# Patient Record
Sex: Male | Born: 1960 | Race: Black or African American | Hispanic: No | Marital: Single | Smoking: Current every day smoker
Health system: Southern US, Community
[De-identification: ages and names within clinical notes are randomized; demographics above are authoritative.]

## PROBLEM LIST (undated history)

## (undated) DIAGNOSIS — I10 Essential (primary) hypertension: Secondary | ICD-10-CM

## (undated) DIAGNOSIS — J189 Pneumonia, unspecified organism: Secondary | ICD-10-CM

## (undated) DIAGNOSIS — K859 Acute pancreatitis without necrosis or infection, unspecified: Secondary | ICD-10-CM

## (undated) DIAGNOSIS — K219 Gastro-esophageal reflux disease without esophagitis: Secondary | ICD-10-CM

## (undated) DIAGNOSIS — M1990 Unspecified osteoarthritis, unspecified site: Secondary | ICD-10-CM

## (undated) DIAGNOSIS — F32A Depression, unspecified: Secondary | ICD-10-CM

## (undated) DIAGNOSIS — F1910 Other psychoactive substance abuse, uncomplicated: Secondary | ICD-10-CM

## (undated) DIAGNOSIS — F329 Major depressive disorder, single episode, unspecified: Secondary | ICD-10-CM

## (undated) DIAGNOSIS — F419 Anxiety disorder, unspecified: Secondary | ICD-10-CM

## (undated) HISTORY — DX: Pneumonia, unspecified organism: J18.9

## (undated) HISTORY — DX: Major depressive disorder, single episode, unspecified: F32.9

## (undated) HISTORY — DX: Gastro-esophageal reflux disease without esophagitis: K21.9

## (undated) HISTORY — DX: Essential (primary) hypertension: I10

## (undated) HISTORY — DX: Unspecified osteoarthritis, unspecified site: M19.90

## (undated) HISTORY — DX: Depression, unspecified: F32.A

## (undated) HISTORY — PX: OTHER SURGICAL HISTORY: SHX169

## (undated) HISTORY — DX: Other psychoactive substance abuse, uncomplicated: F19.10

## (undated) HISTORY — DX: Acute pancreatitis without necrosis or infection, unspecified: K85.90

## (undated) HISTORY — DX: Anxiety disorder, unspecified: F41.9

---

## 2005-10-28 ENCOUNTER — Emergency Department: Payer: Self-pay | Admitting: Emergency Medicine

## 2010-11-23 ENCOUNTER — Inpatient Hospital Stay: Payer: Self-pay | Admitting: Surgery

## 2010-11-26 LAB — CEA: CEA: 3.8 ng/mL (ref 0.0–4.7)

## 2010-11-26 LAB — CANCER ANTIGEN 19-9: CA 19-9: 157 U/mL — ABNORMAL HIGH (ref 0–35)

## 2010-11-30 LAB — PATHOLOGY REPORT

## 2012-05-21 ENCOUNTER — Emergency Department: Payer: Self-pay | Admitting: Emergency Medicine

## 2012-05-21 LAB — CBC
HCT: 40.8 % (ref 40.0–52.0)
HGB: 14.2 g/dL (ref 13.0–18.0)
MCH: 32 pg (ref 26.0–34.0)
MCHC: 34.7 g/dL (ref 32.0–36.0)
Platelet: 331 10*3/uL (ref 150–440)
RBC: 4.43 10*6/uL (ref 4.40–5.90)

## 2012-05-21 LAB — BASIC METABOLIC PANEL
Anion Gap: 10 (ref 7–16)
BUN: 9 mg/dL (ref 7–18)
Calcium, Total: 9.5 mg/dL (ref 8.5–10.1)
Chloride: 108 mmol/L — ABNORMAL HIGH (ref 98–107)
Co2: 22 mmol/L (ref 21–32)
Creatinine: 1.07 mg/dL (ref 0.60–1.30)
EGFR (African American): >60
Potassium: 4 mmol/L (ref 3.5–5.1)
Sodium: 140 mmol/L (ref 136–145)

## 2012-05-21 LAB — TROPONIN I: Troponin-I: <0.02 ng/mL

## 2012-05-21 LAB — CK TOTAL AND CKMB (NOT AT ARMC): CK, Total: 213 U/L (ref 35–232)

## 2013-05-18 LAB — CBC WITH DIFFERENTIAL/PLATELET
Basophil #: 0 10*3/uL (ref 0.0–0.1)
Eosinophil #: 0 10*3/uL (ref 0.0–0.7)
Eosinophil %: 0 %
HCT: 41.7 % (ref 40.0–52.0)
Lymphocyte #: 0.2 10*3/uL — ABNORMAL LOW (ref 1.0–3.6)
Monocyte #: 0 x10 3/mm — ABNORMAL LOW (ref 0.2–1.0)
Monocyte %: 0.4 %
Platelet: 308 10*3/uL (ref 150–440)

## 2013-05-18 LAB — URINALYSIS, COMPLETE
Bacteria: NONE SEEN
Glucose,UR: 50 mg/dL (ref 0–75)
Leukocyte Esterase: NEGATIVE
Nitrite: NEGATIVE
Ph: 5 (ref 4.5–8.0)
Protein: NEGATIVE
Specific Gravity: 1.016 (ref 1.003–1.030)
Squamous Epithelial: 1
WBC UR: 5 /HPF (ref 0–5)

## 2013-05-18 LAB — COMPREHENSIVE METABOLIC PANEL
Alkaline Phosphatase: 1492 U/L — ABNORMAL HIGH
Anion Gap: 7 (ref 7–16)
Bilirubin,Total: 3.9 mg/dL — ABNORMAL HIGH (ref 0.2–1.0)
Chloride: 101 mmol/L (ref 98–107)
Co2: 29 mmol/L (ref 21–32)
Creatinine: 0.88 mg/dL (ref 0.60–1.30)
EGFR (African American): >60
EGFR (Non-African Amer.): >60
Glucose: 97 mg/dL (ref 65–99)
Potassium: 3.3 mmol/L — ABNORMAL LOW (ref 3.5–5.1)
SGOT(AST): 490 U/L — ABNORMAL HIGH (ref 15–37)
SGPT (ALT): 271 U/L — ABNORMAL HIGH (ref 12–78)
Total Protein: 8.2 g/dL (ref 6.4–8.2)

## 2013-05-18 LAB — MAGNESIUM: Magnesium: 1.9 mg/dL

## 2013-05-18 LAB — LIPASE, BLOOD: Lipase: 200 U/L (ref 73–393)

## 2013-05-19 ENCOUNTER — Inpatient Hospital Stay: Payer: Self-pay | Admitting: Specialist

## 2013-05-20 LAB — CBC WITH DIFFERENTIAL/PLATELET
Basophil #: 0 10*3/uL (ref 0.0–0.1)
Eosinophil #: 0.2 10*3/uL (ref 0.0–0.7)
HCT: 33.6 % — ABNORMAL LOW (ref 40.0–52.0)
HGB: 10.8 g/dL — ABNORMAL LOW (ref 13.0–18.0)
Lymphocyte %: 8.7 %
MCH: 29.9 pg (ref 26.0–34.0)
Monocyte #: 0.6 x10 3/mm (ref 0.2–1.0)
Monocyte %: 4.3 %
Platelet: 224 10*3/uL (ref 150–440)
WBC: 13.4 10*3/uL — ABNORMAL HIGH (ref 3.8–10.6)

## 2013-05-20 LAB — COMPREHENSIVE METABOLIC PANEL
Anion Gap: 2 — ABNORMAL LOW (ref 7–16)
Bilirubin,Total: 1.7 mg/dL — ABNORMAL HIGH (ref 0.2–1.0)
Calcium, Total: 8.5 mg/dL (ref 8.5–10.1)
Chloride: 107 mmol/L (ref 98–107)
Co2: 32 mmol/L (ref 21–32)
Creatinine: 0.96 mg/dL (ref 0.60–1.30)
EGFR (Non-African Amer.): >60
Glucose: 125 mg/dL — ABNORMAL HIGH (ref 65–99)
Osmolality: 280 (ref 275–301)
Sodium: 141 mmol/L (ref 136–145)
Total Protein: 5.8 g/dL — ABNORMAL LOW (ref 6.4–8.2)

## 2013-05-21 LAB — CBC WITH DIFFERENTIAL/PLATELET
Eosinophil %: 1.8 %
HCT: 32 % — ABNORMAL LOW (ref 40.0–52.0)
HGB: 10.7 g/dL — ABNORMAL LOW (ref 13.0–18.0)
Lymphocyte #: 1.1 10*3/uL (ref 1.0–3.6)
MCH: 31.3 pg (ref 26.0–34.0)
MCV: 94 fL (ref 80–100)
Monocyte #: 0.5 x10 3/mm (ref 0.2–1.0)
Monocyte %: 5.2 %
Neutrophil #: 7.7 10*3/uL — ABNORMAL HIGH (ref 1.4–6.5)
Neutrophil %: 80.9 %
RBC: 3.41 10*6/uL — ABNORMAL LOW (ref 4.40–5.90)

## 2013-05-21 LAB — COMPREHENSIVE METABOLIC PANEL
Alkaline Phosphatase: 715 U/L — ABNORMAL HIGH
Anion Gap: 5 — ABNORMAL LOW (ref 7–16)
BUN: 6 mg/dL — ABNORMAL LOW (ref 7–18)
Bilirubin,Total: 1 mg/dL (ref 0.2–1.0)
Calcium, Total: 8.3 mg/dL — ABNORMAL LOW (ref 8.5–10.1)
Creatinine: 0.9 mg/dL (ref 0.60–1.30)
EGFR (African American): >60
Glucose: 125 mg/dL — ABNORMAL HIGH (ref 65–99)
Osmolality: 277 (ref 275–301)
Potassium: 3.5 mmol/L (ref 3.5–5.1)
SGOT(AST): 44 U/L — ABNORMAL HIGH (ref 15–37)
Sodium: 139 mmol/L (ref 136–145)
Total Protein: 5.8 g/dL — ABNORMAL LOW (ref 6.4–8.2)

## 2013-05-22 LAB — HEPATIC FUNCTION PANEL A (ARMC)
Albumin: 2.3 g/dL — ABNORMAL LOW (ref 3.4–5.0)
Alkaline Phosphatase: 619 U/L — ABNORMAL HIGH
Bilirubin, Direct: 0.4 mg/dL — ABNORMAL HIGH (ref 0.00–0.20)
SGOT(AST): 27 U/L (ref 15–37)
SGPT (ALT): 72 U/L (ref 12–78)
Total Protein: 5.8 g/dL — ABNORMAL LOW (ref 6.4–8.2)

## 2013-06-18 ENCOUNTER — Emergency Department: Payer: Self-pay | Admitting: Emergency Medicine

## 2013-06-18 LAB — URINALYSIS, COMPLETE
Bilirubin,UR: NEGATIVE
Nitrite: POSITIVE
PH: 5 (ref 4.5–8.0)
Protein: 30
RBC,UR: 16 /HPF (ref 0–5)
Specific Gravity: 1.019 (ref 1.003–1.030)
Squamous Epithelial: 1
WBC UR: 613 /HPF (ref 0–5)

## 2013-06-21 LAB — URINE CULTURE

## 2013-07-31 LAB — CULTURE, BLOOD (SINGLE)

## 2014-01-23 LAB — CBC AND DIFFERENTIAL
HCT: 38 % — AB (ref 41–53)
HEMOGLOBIN: 13 g/dL — AB (ref 13.5–17.5)
Neutrophils Absolute: 2 /uL
Platelets: 373 10*3/uL (ref 150–399)
WBC: 4.8 10^3/mL

## 2014-01-23 LAB — HEMOGLOBIN A1C: Hemoglobin A1C: 5.9

## 2014-01-23 LAB — HEPATIC FUNCTION PANEL
ALK PHOS: 162 U/L — AB (ref 25–125)
ALT: 100 U/L — AB (ref 10–40)
AST: 100 U/L — AB (ref 14–40)
BILIRUBIN, TOTAL: 0.4 mg/dL
Bilirubin, Direct: 0.18 mg/dL (ref 0.01–0.4)

## 2014-01-23 LAB — TSH: TSH: 0.68 u[IU]/mL (ref 0.41–5.90)

## 2014-02-05 LAB — BASIC METABOLIC PANEL
BUN: 15 mg/dL (ref 4–21)
CREATININE: 1.1 mg/dL (ref 0.6–1.3)
Glucose: 136 mg/dL
POTASSIUM: 5 mmol/L (ref 3.4–5.3)
SODIUM: 137 mmol/L (ref 137–147)

## 2014-02-14 ENCOUNTER — Ambulatory Visit: Payer: Self-pay | Admitting: Urology

## 2014-02-19 DIAGNOSIS — R7989 Other specified abnormal findings of blood chemistry: Secondary | ICD-10-CM | POA: Insufficient documentation

## 2014-02-19 DIAGNOSIS — R945 Abnormal results of liver function studies: Secondary | ICD-10-CM | POA: Insufficient documentation

## 2014-09-19 NOTE — Discharge Summary (Signed)
PATIENT NAME:  Sean MurrayLONG, Sean A MR#:  [id number] DATE OF BIRTH:  Oct 10, 1960  DATE OF ADMISSION:  05/19/2013 DATE OF DISCHARGE:  05/22/2013  HISTORY OF PRESENT ILLNESS:  For a detailed note, please take a look at the history and physical done on admission by Dr. Angelica Ranavid Hower.   DIAGNOSES AT DISCHARGE:  As follows:  Cholangitis with history of biliary stricture, abnormal liver LFTs secondary to cholangitis. Status post endoscopic retrograde cholangiopancreatography with sphincterotomy. Gram-negative bacteremia.   DIET: The patient is being discharged on a low-fat diet.   ACTIVITY: As tolerated.   FOLLOWUP: Follow up with Dr. Mechele CollinElliott in the next 1 to 2 weeks.   DISCHARGE MEDICATIONS:  Augmentin 500 mg/125 one tab b.i.d. x 11 days.   CONSULTANTS DURING THE HOSPITAL COURSE: Dr. Lutricia FeilPaul Oh and Dr. Mechele CollinElliott from gastroenterology.   PERTINENT STUDIES DONE DURING THE HOSPITAL COURSE: Are as follows: A chest x-ray done on admission showing no acute cardiopulmonary disease. An ultrasound of the abdomen, limited, showing upper limits of normal common bile duct caliber without evidence of intrahepatic ductal dilatation, sludge within the common bile duct identified. A CT scan of the abdomen and pelvis done with contrast showing common bile duct wall thickening and enhancement, question cholangitis; 2 mm distal common bile duct stone which does not appear to be obstructing. ERCP done on 05/20/2013 showing a normal cholangiogram, no evidence of any mass or stone status post sphincterotomy.   HOSPITAL COURSE: This is a 54 year old male with medical problems as mentioned above, who presented to the hospital with abdominal pain and nausea, vomiting, and noted to have abnormal LFTs.  1.  Abdominal pain, nausea, vomiting and abnormal LFTs. This was likely secondary to cholangitis. The patient had a limited abdominal ultrasound and CT scan findings which were suggestive of choledocholithiasis and a common bile duct  dilatation. The patient does have a history of a biliary stricture, status post stent placement in 2012. The patient was empirically started on IV Zosyn, started on IV fluids, antiemetics and pain control. A GI consult was obtained. The patient underwent ERCP on December 22nd which did not show any evidence of a stone or any obstruction or mass. The patient did undergo a sphincterotomy. Post and sphincterotomy, patient's abdominal pain and LFTs have significantly improved. He is now tolerating a regular diet well and, therefore, being discharged home. The patient will have followup with GI as an outpatient.  2.  Bacteremia. This was likely secondary to the cholangitis. The patient's blood cultures grew out gram-negative rods which have not been identified yet. The patient's blood cultures also grew out gram-variable rod which has also not been identified yet. The patient has been afebrile for the past 48 hours and hemodynamically stable, tolerating p.o. well. I did discharge him on oral Augmentin. His blood cultures will further be followed. If his antibiotics need to be changed, we will give him a call and let him know.  3.  Alcohol abuse. The patient was maintained on CIWA protocol in the hospital. He had no evidence of DTs prior to discharge.  4.  CODE STATUS: The patient is a full code.   TIME SPENT: 40 minutes.    ____________________________ Rolly PancakeVivek J. Cherlynn KaiserSainani, MD vjs:cs D: 05/22/2013 15:33:41 ET T: 05/22/2013 15:46:39 ET JOB#: [id number]  cc: Rolly PancakeVivek J. Cherlynn KaiserSainani, MD, <Dictator> Scot Junobert T. Elliott, MD Houston SirenVIVEK J Beverlie Kurihara MD ELECTRONICALLY SIGNED 05/23/2013 13:48

## 2014-09-19 NOTE — Consult Note (Signed)
ERCP done. Prominent ampulla. Nl CBD. No stone or stent seen. Multiple balloon sweeps done. Biliary sphincterotomy done. Good bile drainage. Montiner LFT.  Electronic Signatures: Lutricia Feilh, Lorann Tani (MD)  (Signed on 22-Dec-14 17:13)  Authored  Last Updated: 22-Dec-14 17:13 by Lutricia Feilh, Heinz Eckert (MD)

## 2014-09-19 NOTE — Consult Note (Signed)
Pt with GNR in blood culture.  Discussed case with Dr. Bluford Kaufmannh. Since the patient is on antibiotic coverage and his vital signs are stable at this time He feels we can schedule him for ERCP for tomorrow afternoon with DR. Oh.  Will talk with patient who has had an ERCP before.  Electronic Signatures: Scot JunElliott, Robert T (MD)  (Signed on 21-Dec-14 12:32)  Authored  Last Updated: 21-Dec-14 12:32 by Scot JunElliott, Robert T (MD)

## 2014-09-19 NOTE — Consult Note (Signed)
Pt with CBD stone on CT, may have passed it, had successful ERCP.  LFT's much better, pt feels much better.  I told him to be sure and take his antibiotics when he goes home due to his infection.  He ate all of his dinner tonight.  Could go home on appropriate antibiotics tomorrow and follow up in office in a week.  Electronic Signatures: Scot JunElliott, Mattison Golay T (MD)  (Signed on 23-Dec-14 17:38)  Authored  Last Updated: 23-Dec-14 17:38 by Scot JunElliott, Dynesha Woolen T (MD)

## 2014-09-19 NOTE — H&P (Signed)
PATIENT NAME:  Sean [name], Sean [name] MR#:  [id number] DATE OF BIRTH:  26-Sep-1960  DATE OF ADMISSION:  05/19/2013  REFERRING PHYSICIAN:  Dr. Sharyn CreamerMark [name].   PRIMARY CARE PHYSICIAN:  None.   CHIEF COMPLAINT:  Diffuse pain.   HISTORY OF PRESENT ILLNESS:  [name] 54 year old African American gentleman with history of gallstones status post biliary stent presenting with diffuse pain.  On further questioning he describes two day duration of mainly abdominal pain, worse with movement, but it has been gradually worsening.  He describes this only as pain rating the current intensity 8 to 9 out of 10, radiating diffusely over his abdomen.  He found no relieving factors.  No association with food; however, he does have associated nausea, vomiting.  Described his emesis as food-containing with bile tinge.  Also describes chills; however, denies any fever.  Does mention having acholic stools as well as darkened urine over the last 1 to 2 days currently without complaints.   REVIEW OF SYSTEMS:  CONSTITUTIONAL:  Positive for chills.  Denies any fever or weakness.  EYES:  Denied blurred vision, double vision, eye pain.  EARS, NOSE, THROAT:  Denies tinnitus, ear pain, hearing loss.  RESPIRATORY:  Denies cough, wheeze, shortness of breath.  CARDIOVASCULAR:  Denies chest pain, palpitations, edema.  GASTROINTESTINAL:  Positive for nausea, vomiting, abdominal pain, however denies any diarrhea or constipation.  GENITOURINARY:  Denies dysuria or hematuria.  ENDOCRINE:  Denies nocturia or thyroid problems.  HEMATOLOGY AND LYMPHATIC:  Denies easy bruising or bleeding.  SKIN:  Denies rash or lesions.  MUSCULOSKELETAL:  Denies pain in neck, back, shoulder, knees, hips, arthritic symptoms.  NEUROLOGIC:  Denies paralysis, paresthesias.  PSYCHIATRIC:  Denies anxiety or depressive symptoms.  Otherwise, full review of systems performed by me is negative.   PAST MEDICAL HISTORY:  Significant for gallstones status post biliary stenting.   He was apparently told to have the stent removed and has never done so.  This was about two years ago.  Denies any further history.   SOCIAL HISTORY:  Every day tobacco use, approximately 1 pack [name] day as well as every day alcohol use 3 to 4 beers daily.  Denies any drug usage.   FAMILY HISTORY:  Denies any cardiovascular, pulmonary or gastrointestinal diseases including biliary disease.   ALLERGIES:  No known drug allergies.   HOME MEDICATIONS:  None.   PHYSICAL EXAMINATION: VITAL SIGNS:  Temperature 97.7, heart rate 110, respirations 24, blood pressure 117/77, saturating 97% on room air.  Weight 61.2 kg, BMI of 21.8.  GENERAL:  Well-nourished, well-developed, African American gentleman, currently in no acute distress.  HEAD:  Normocephalic, atraumatic.  EYES:  Pupils equal, round, reactive to light.  Extraocular muscles intact with scleral icterus.  MOUTH:  Moist mucosal membranes.  Dentition intact.  No abscess noted.  Positive sublingual jaundice.   EAR, NOSE, THROAT:  Throat clear without exudates.  No external lesions.  NECK:  Supple.  No thyromegaly.  No nodules.  No JVD.  PULMONARY:  Clear to auscultation bilaterally.  No wheezes, rubs or rhonchi.  No use of accessory muscles.  Good respiratory effort.  CHEST:  Nontender to palpation.  CARDIOVASCULAR:  S1, S2, regular rate and rhythm.  No murmurs, rubs or gallops.  No edema.  Pedal pulses 2+ bilaterally.  GASTROINTESTINAL:  Soft, minimally tender to palpation over the epigastric region without rebound or guarding, nondistended.  No masses.  Murphy sign negative.  Hypoactive bowel sounds.  No hepatosplenomegaly.  MUSCULOSKELETAL:  No  swelling, clubbing or edema.  Range of motion full in all extremities.  NEUROLOGIC:  Cranial nerves II through XII intact.  No gross focal neurological deficits.  Sensation intact.  Reflexes intact.  SKIN:  No ulcerations, lesions, rashes, cyanosis.  Skin warm and dry.  Turgor is intact.  PSYCHIATRIC:   Mood and affect within normal limits.  The patient awake, alert, oriented x 3.   LABORATORY DATA:  Sodium 137, potassium 3.3, chloride 101, bicarb 29, BUN 6, creatinine 0.88, lipase 200.  LFTs:  Total protein 8.2, albumin 3.6, total bilirubin 3.9, alkaline phosphatase 1192, AST 490, ALT 271.  WBC 10.4, hemoglobin 14.3, platelets 308.  Urinalysis negative for evidence of infection.  Abdominal ultrasound performed revealing no focal obstruction, common bile duct is at upper limits of normal.   ASSESSMENT AND PLAN:  [name] 54 year old [place] American gentleman with history of gallstones status post biliary stent presenting with abdominal pain, found to have obstructive jaundice.  1.  Obstructive jaundice with history of biliary stenting.  Ultrasound has essentially been unrevealing.  CT scan is pending at this time.  Case has been discussed with gastroenterologist Dr. Mechele Collin who recommended blood cultures as well as Zosyn antibiotic coverage.  Gastroenterology consult has also been placed.  They will see the patient in the morning.  2.  History of alcohol abuse.  We will initiate CIWA protocol.  3.  Hypokalemia:  Replace to goal of 4 to 5.  4.  VTE prophylaxis with SCDs.   5.  CODE STATUS:  THE PATIENT IS FULL CODE.  TIME SPENT:  45 minutes.     ____________________________ Sean Athens. [name], MD dkh:ea D: 05/19/2013 00:28:28 ET T: 05/19/2013 03:06:00 ET JOB#: [id number]  cc: Sean Athens. Arayla Kruschke, MD, <Dictator> Riker Collier Synetta Shadow MD ELECTRONICALLY SIGNED 05/19/2013 22:49

## 2014-09-20 NOTE — Consult Note (Signed)
PATIENT NAME:  Sean [name], Sean [name] MR#:  [id number] DATE OF BIRTH:  [birth year]  DATE OF CONSULTATION:  05/19/2013   CONSULTING PHYSICIAN:  Scot Junobert T. Zevin Nevares, MD  ADDENDUM Sean [name] had an ERCP done by Dr. Niel HummerIftikhar on 11/24/2010. The lower third of the main common bile duct contained [name] questionable segmental stenosis 2 cm in length. The main bile duct was not dilated and no definite stones were seen at that time. [name] biliary sphincterotomy was made. There was no post sphincterotomy bleeding. The biliary tree was swept with 8.5 balloon  [name] 7 French 7 cm stent with [name] single external flap and single internal flap was placed 6 cm into the bile duct after obtaining brushings. The stent was felt to be in good position at the end of the procedure. He underwent gallbladder removal on 06/28 by Dr. Excell Seltzerooper. The findings at the time were acute cholecystitis, greatly edematous infundibulum and gallbladder fossa. There was no sign of [name] bile leak after the surgery.  The patient was discharged home the following day.    ____________________________ Scot Junobert T. Haily Caley, MD rte:sg D: 05/19/2013 09:53:26 ET T: 05/19/2013 10:41:13 ET JOB#: [id number]  cc: Scot Junobert T. Vi Whitesel, MD, <Dictator> Scot JunOBERT T Eriverto Byrnes MD ELECTRONICALLY SIGNED 06/13/2013 17:35

## 2014-09-20 NOTE — Consult Note (Signed)
PATIENT NAME:  Sean [name], Sean [name] MR#:  [id number] DATE OF BIRTH:  02/02/1961  DATE OF CONSULTATION:  05/19/2013  CONSULTING PHYSICIAN:  Scot Jun, MD  The patient is a 54 year old black male who had a previous gallbladder removed, who had ERCP and stent placement by Dr. Niel Hummer 2 or 3 years ago. He was advised to come back and have it removed and he never went back for his follow-up appointment. He presented to the ER with a couple of days of abdominal pain in his abdomen and in his back. He was found to have elevated liver functions, to have abnormal CT scan showing a common bile duct stone, as well as what looks to be a stent in the distal duodenum, first part of the jejunum. For all of these problems, he was appropriately admitted to the hospital and I was asked to see him in consultation.   ALLERGIES: No known drug allergies.   MEDICATIONS ON ADMISSION: No regular medications.   HABITS: Smokes half to 1 pack a day. Drinks mostly beer, mostly [employer].  PAST MEDICAL HISTORY: He had a colonoscopy at [facility] many years ago.   WORK HISTORY: He used to work as a Therapist, sports yards. Has not worked in the last couple of years.   REVIEW OF SYSTEMS: He denies any shortness of breath. No irregular skipping heartbeats. No chest pains. No dysphagia. He does have infrequent heartburn. His appetite is generally okay. There is no rectal bleeding. No dysuria, no hematuria. No kidney stones. He has nocturia x 2. No history of diabetes.   PHYSICAL EXAMINATION: GENERAL: Black male in no acute distress.  VITAL SIGNS: Temperature 98.2, pulse 96, respirations 18, blood pressure 110/76, pulse oximetry 98% on room air.  HEENT: Sclerae slightly icteric. Conjunctivae negative. Tongue negative.  HEAD: Atraumatic.  CHEST: Clear.  HEART: Shows no murmurs or gallops I can hear   ABDOMEN: Nondistended. Bowel sounds present. No hepatosplenomegaly.  EXTREMITIES: He has a small appendage on the right hand  number 5 finger, which runs in his family.   LABORATORY DATA: Glucose 97, BUN 6, creatinine 0.88, sodium 137, potassium 3.3, chloride 101, CO2 29, calcium 9.3, phosphorus 2.5, magnesium 1.9, total protein 8.2, albumin 3.6, total bilirubin 3.9, alkaline phosphatase 1492, SGOT 490, SGPT 271.   CAT scan of the abdomen shows wall thickening and enhancement of the common bile duct, question cholangitis, a 2 mm stone is identified in the distal common bile duct. Calcifications in the pancreas compatible with chronic calcific pancreatitis noted. There appears to be a common bile duct stent present within the distal duodenum and proximal jejunum. Ultrasound of the abdomen shows no evidence of intrahepatic biliary dilatation, trace amount of free fluid in the right upper abdomen. PA and lateral of the chest shows no evidence of acute cardiopulmonary disease.   ASSESSMENT/PLAN: He has a common duct stone and he has a very elevated alkaline phosphatase. 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 He appears to have had a stent in place and now appears to be wedged into the distal duodenum.   RECOMMENDATIONS:  1. Agree with antibiotic coverage for now.  2. We will need ERCP and probable sphincterotomy with stone removal, maybe brushings of the lining of the common bile duct, probably will need a upper endoscopy with a colonoscope to retrieve the stent.  I will consult Dr. Bluford Kaufmann tomorrow for the procedure.   ____________________________  Scot Junobert T. Mikah Poss, MD rte:sg D: 05/19/2013 09:49:16 ET T: 05/19/2013 10:23:41 ET JOB#: [id number]  cc: Scot Junobert T. Karron Alvizo, MD, <Dictator> Cletis Athensavid K. Hower, MD Rolly PancakeVivek J. Cherlynn KaiserSainani, MD  Scot JunOBERT T Eulises Kijowski MD ELECTRONICALLY SIGNED 06/13/2013 17:35

## 2014-09-24 ENCOUNTER — Emergency Department
Admit: 2014-09-24 | Disposition: A | Discharge status: Home / Self Care | Payer: Self-pay | Admitting: Emergency Medicine

## 2015-10-27 DIAGNOSIS — R7989 Other specified abnormal findings of blood chemistry: Secondary | ICD-10-CM

## 2015-10-27 DIAGNOSIS — R945 Abnormal results of liver function studies: Secondary | ICD-10-CM

## 2016-09-29 ENCOUNTER — Ambulatory Visit: Payer: Self-pay | Admitting: Urology

## 2016-09-29 VITALS — BP 115/81 | HR 91 | Temp 97.9°F | Ht 65.0 in | Wt 110.9 lb

## 2016-09-29 DIAGNOSIS — R945 Abnormal results of liver function studies: Secondary | ICD-10-CM

## 2016-09-29 DIAGNOSIS — R7989 Other specified abnormal findings of blood chemistry: Secondary | ICD-10-CM

## 2016-09-29 NOTE — Progress Notes (Signed)
  Patient: Sean [name] A [place] Male    DOB: 12/01/1960   56 y.o.   MRN: [id number] Visit Date: 09/29/2016  Today's Provider: [facility]   Chief Complaint  Patient presents with  . Labs Only   Subjective:    HPI   Patient states he needs labs drawn for an application for disability.   He denies any medical problems.  He says he whole body is falling apart.  Complains of leg cramps, stays tired, changes in vision.  He states he has a stent in his gallbladder and he was supposed to go back but did not follow up.    Having urine and fecal incontinence.    Hospitalized for pneumonia x 2.      No Known Allergies Previous Medications   No medications on file    Review of Systems  All other systems reviewed and are negative.   Social History  Substance Use Topics  . Smoking status: Current Every Day Smoker    Packs/day: 0.50    Years: 15.00    Types: Cigarettes  . Smokeless tobacco: Never Used  . Alcohol use 50.4 oz/week    84 Cans of beer per week     Comment: 40 oz four times/day   Objective:   BP 115/81   Pulse 91   Temp 97.9 F (36.6 C)   Ht 5\' 5"  (1.651 m)   Wt 110 lb 14.4 oz (50.3 kg)   BMI 18.45 kg/m   Physical Exam  Constitutional: He appears well-developed and well-nourished.  HENT:  Head: Normocephalic and atraumatic.  Right Ear: External ear normal.  Left Ear: External ear normal.  Cardiovascular: Normal rate, regular rhythm and normal heart sounds.   Pulmonary/Chest: Effort normal. He has wheezes. He has rales.  Musculoskeletal:  Very tender paraspinal muscles        Assessment & Plan:     1. History of abnormal LFT's  - drinks ETOH  - had stent put in the gallbladder  2. ETOH abuse  - 40 oz x 4  - occasional hard liquor  - arrange rehab - need to research  3. Tobacco abuse   4. Fecal and urine incontinence    Follow up to review labs      [facility]   [facility]

## 2016-10-03 LAB — COMPREHENSIVE METABOLIC PANEL
A/G RATIO: 1.6 (ref 1.2–2.2)
ALBUMIN: 4.7 g/dL (ref 3.5–5.5)
ALK PHOS: 88 IU/L (ref 39–117)
ALT: 27 IU/L (ref 0–44)
AST: 40 IU/L (ref 0–40)
BUN / CREAT RATIO: 12 (ref 9–20)
BUN: 10 mg/dL (ref 6–24)
CHLORIDE: 107 mmol/L — AB (ref 96–106)
CO2: 19 mmol/L (ref 18–29)
Calcium: 10.2 mg/dL (ref 8.7–10.2)
Creatinine, Ser: 0.81 mg/dL (ref 0.76–1.27)
GFR calc non Af Amer: 99 mL/min/{1.73_m2} (ref >59)
GFR, EST AFRICAN AMERICAN: 114 mL/min/{1.73_m2} (ref >59)
GLUCOSE: 97 mg/dL (ref 65–99)
Globulin, Total: 3 g/dL (ref 1.5–4.5)
POTASSIUM: 4.7 mmol/L (ref 3.5–5.2)
Sodium: 151 mmol/L — ABNORMAL HIGH (ref 134–144)
TOTAL PROTEIN: 7.7 g/dL (ref 6.0–8.5)

## 2016-10-03 LAB — LIPID PANEL
Chol/HDL Ratio: 3.3 ratio (ref 0.0–5.0)
Cholesterol, Total: 177 mg/dL (ref 100–199)
HDL: 54 mg/dL (ref >39)
LDL Calculated: 75 mg/dL (ref 0–99)
Triglycerides: 240 mg/dL — ABNORMAL HIGH (ref 0–149)
VLDL CHOLESTEROL CAL: 48 mg/dL — AB (ref 5–40)

## 2016-10-03 LAB — CBC WITH DIFFERENTIAL/PLATELET
BASOS ABS: 0 10*3/uL (ref 0.0–0.2)
Basos: 1 %
EOS (ABSOLUTE): 0 10*3/uL (ref 0.0–0.4)
Eos: 1 %
HEMATOCRIT: 34.5 % — AB (ref 37.5–51.0)
Hemoglobin: 11.6 g/dL — ABNORMAL LOW (ref 13.0–17.7)
Immature Grans (Abs): 0 10*3/uL (ref 0.0–0.1)
Immature Granulocytes: 0 %
LYMPHS ABS: 2.1 10*3/uL (ref 0.7–3.1)
Lymphs: 35 %
MCH: 32.2 pg (ref 26.6–33.0)
MCHC: 33.6 g/dL (ref 31.5–35.7)
MCV: 96 fL (ref 79–97)
MONOS ABS: 0.4 10*3/uL (ref 0.1–0.9)
Monocytes: 7 %
Neutrophils Absolute: 3.4 10*3/uL (ref 1.4–7.0)
Neutrophils: 56 %
Platelets: 295 10*3/uL (ref 150–379)
RBC: 3.6 x10E6/uL — AB (ref 4.14–5.80)
RDW: 14.1 % (ref 12.3–15.4)
WBC: 6 10*3/uL (ref 3.4–10.8)

## 2016-10-03 LAB — LIPASE: Lipase: 24 U/L (ref 13–78)

## 2016-10-03 LAB — HEMOGLOBIN A1C
ESTIMATED AVERAGE GLUCOSE: 151 mg/dL
HEMOGLOBIN A1C: 6.9 % — AB (ref 4.8–5.6)

## 2016-10-03 LAB — TSH: TSH: 0.543 u[IU]/mL (ref 0.450–4.500)

## 2016-10-03 LAB — AMYLASE: AMYLASE: 51 U/L (ref 31–124)

## 2016-10-03 LAB — HCV RNA QUANT: Hepatitis C Quantitation: NOT DETECTED IU/mL

## 2016-10-05 ENCOUNTER — Telehealth: Payer: Self-pay

## 2016-10-05 NOTE — Telephone Encounter (Signed)
Called pt with results. PT verbalized understanding and said he would go. Told pt to make f/u appt after her gets out and his sodium level is corrected.

## 2016-10-05 NOTE — Telephone Encounter (Signed)
-----   Message from Harle BattiestShannon A McGowan, PA-C sent at 10/04/2016  2:01 PM EDT ----- Patient's sodium level is elevated.  I suggest he seek treatment in the ED as the sodium level has to be corrected slowly.

## 2016-10-06 ENCOUNTER — Ambulatory Visit: Payer: Self-pay | Admitting: Ophthalmology

## 2016-10-13 ENCOUNTER — Ambulatory Visit: Payer: Self-pay | Admitting: Urology

## 2016-10-13 VITALS — BP 116/80 | HR 96 | Temp 98.2°F | Wt 112.0 lb

## 2016-10-13 DIAGNOSIS — K921 Melena: Secondary | ICD-10-CM

## 2016-10-13 DIAGNOSIS — Z720 Tobacco use: Secondary | ICD-10-CM

## 2016-10-13 DIAGNOSIS — F1010 Alcohol abuse, uncomplicated: Secondary | ICD-10-CM

## 2016-10-13 DIAGNOSIS — D649 Anemia, unspecified: Secondary | ICD-10-CM

## 2016-10-13 NOTE — Progress Notes (Signed)
  Patient: Sean [name] Male    DOB: 07/11/1960   56 y.o.   MRN: [id number] Visit Date: 10/13/2016  Today's Provider: Michiel CowboySHANNON Jerone Cudmore, PA-C   No chief complaint on file.  Subjective:    HPI Patient to review labs  Sodium 151, RBC's 3.60, Hbg 11.6, HCT 34.5 and HbgA1c 6.9%  Other labs are wnl  He states that since he has had his blood work drawn he has had difficulty swallowing.    Patient states he needs labs drawn for an application for disability.   He denies any medical problems.  He says he whole body is falling apart.  Complains of leg cramps, stays tired, changes in vision.  He states he has a stent in his gallbladder and he was supposed to go back but did not follow up.  He may have had an ERCP and the stent was dealt with at that time.    Having urine and fecal incontinence.  It is improving.  He states that he is having oily blood in his stools.      Hospitalized for pneumonia x 2.      No Known Allergies Previous Medications   No medications on file    Review of Systems  All other systems reviewed and are negative.   Social History  Substance Use Topics  . Smoking status: Current Every Day Smoker    Packs/day: 0.50    Years: 15.00    Types: Cigarettes  . Smokeless tobacco: Never Used  . Alcohol use 50.4 oz/week    84 Cans of beer per week     Comment: 40 oz four times/day   Objective:   BP 116/80   Pulse 96   Temp 98.2 F (36.8 C)   Wt 112 lb (50.8 kg)   BMI 18.64 kg/m   Physical Exam  Constitutional: He appears well-developed and well-nourished.  Smell of alcohol on breath  HENT:  Head: Normocephalic and atraumatic.  Right Ear: External ear normal.  Left Ear: External ear normal.  Cardiovascular: Normal rate, regular rhythm and normal heart sounds.   Pulmonary/Chest: Effort normal. He has wheezes. He has rales.  Abdominal: Soft. Bowel sounds are normal. He exhibits no distension and no mass. There is no tenderness. There is no rebound and no  guarding.  Musculoskeletal:  Very tender paraspinal muscles        Assessment & Plan:     1. History of abnormal LFT's  - drinks ETOH  - had stent put in the gallbladder  2. ETOH abuse  - 40 oz x 4  - occasional hard liquor  - patient is not wanting to stop drinking at this time  3. Tobacco abuse  - patient is not wanting to stop smoking at this time   4. Fecal and urine incontinence  - patient states it is improving  5. High sodium level  - patient instructed drink  80 ounces of water every two hours while awake  - recheck sodium in two weeks  6. History of pneumonia  - needs follow up chest x-ray - waiting on charity care  7. Low BMI despite high caloric intake  - ? malignancy  8. Bloody stools  - arrange for colonoscopy once charity care is approved  9. Anemia  - waiting on colonscopy        Michiel CowboySHANNON Nezar Buckles, PA-C   [facility]

## 2016-10-27 ENCOUNTER — Other Ambulatory Visit: Payer: Self-pay

## 2016-10-27 DIAGNOSIS — R945 Abnormal results of liver function studies: Secondary | ICD-10-CM

## 2016-10-27 DIAGNOSIS — R7989 Other specified abnormal findings of blood chemistry: Secondary | ICD-10-CM

## 2016-10-28 LAB — HEPATIC FUNCTION PANEL
ALBUMIN: 3.6 g/dL (ref 3.5–5.5)
ALT: 27 IU/L (ref 0–44)
AST: 43 IU/L — ABNORMAL HIGH (ref 0–40)
Alkaline Phosphatase: 160 IU/L — ABNORMAL HIGH (ref 39–117)
BILIRUBIN TOTAL: 0.4 mg/dL (ref 0.0–1.2)
Bilirubin, Direct: 0.14 mg/dL (ref 0.00–0.40)
TOTAL PROTEIN: 6.8 g/dL (ref 6.0–8.5)

## 2016-10-28 LAB — SODIUM: SODIUM: 143 mmol/L (ref 134–144)

## 2016-11-21 ENCOUNTER — Encounter: Payer: Self-pay | Admitting: Gastroenterology

## 2016-11-21 ENCOUNTER — Ambulatory Visit: Payer: Self-pay | Admitting: Gastroenterology

## 2017-01-25 ENCOUNTER — Inpatient Hospital Stay: Admit: 2017-01-25 | Payer: Self-pay

## 2017-01-25 ENCOUNTER — Other Ambulatory Visit
Admission: RE | Admit: 2017-01-25 | Discharge: 2017-01-25 | Disposition: A | Discharge status: Home / Self Care | Payer: Medicaid Other | Source: Ambulatory Visit | Attending: Gastroenterology | Admitting: Gastroenterology

## 2017-01-25 ENCOUNTER — Ambulatory Visit (INDEPENDENT_AMBULATORY_CARE_PROVIDER_SITE_OTHER): Payer: Medicaid Other | Admitting: Gastroenterology

## 2017-01-25 ENCOUNTER — Encounter (INDEPENDENT_AMBULATORY_CARE_PROVIDER_SITE_OTHER): Payer: Self-pay

## 2017-01-25 ENCOUNTER — Encounter: Payer: Self-pay | Admitting: Gastroenterology

## 2017-01-25 VITALS — BP 123/84 | HR 92 | Temp 98.2°F | Ht 65.0 in | Wt 121.6 lb

## 2017-01-25 DIAGNOSIS — F1991 Other psychoactive substance use, unspecified, in remission: Secondary | ICD-10-CM

## 2017-01-25 DIAGNOSIS — R7989 Other specified abnormal findings of blood chemistry: Secondary | ICD-10-CM

## 2017-01-25 DIAGNOSIS — K921 Melena: Secondary | ICD-10-CM | POA: Insufficient documentation

## 2017-01-25 DIAGNOSIS — K859 Acute pancreatitis without necrosis or infection, unspecified: Secondary | ICD-10-CM | POA: Insufficient documentation

## 2017-01-25 DIAGNOSIS — R945 Abnormal results of liver function studies: Secondary | ICD-10-CM

## 2017-01-25 DIAGNOSIS — Z87898 Personal history of other specified conditions: Secondary | ICD-10-CM | POA: Diagnosis not present

## 2017-01-25 LAB — URINE DRUG SCREEN, QUALITATIVE (ARMC ONLY)
AMPHETAMINES, UR SCREEN: NOT DETECTED
Barbiturates, Ur Screen: NOT DETECTED
Benzodiazepine, Ur Scrn: NOT DETECTED
CANNABINOID 50 NG, UR ~~LOC~~: NOT DETECTED
COCAINE METABOLITE, UR ~~LOC~~: POSITIVE — AB
MDMA (ECSTASY) UR SCREEN: NOT DETECTED
Methadone Scn, Ur: NOT DETECTED
Opiate, Ur Screen: NOT DETECTED
PHENCYCLIDINE (PCP) UR S: NOT DETECTED
TRICYCLIC, UR SCREEN: NOT DETECTED

## 2017-01-25 LAB — COMPREHENSIVE METABOLIC PANEL
ALT: 47 U/L (ref 17–63)
AST: 126 U/L — AB (ref 15–41)
Albumin: 3.6 g/dL (ref 3.5–5.0)
Alkaline Phosphatase: 137 U/L — ABNORMAL HIGH (ref 38–126)
Anion gap: 7 (ref 5–15)
BILIRUBIN TOTAL: 0.6 mg/dL (ref 0.3–1.2)
BUN: 12 mg/dL (ref 6–20)
CO2: 27 mmol/L (ref 22–32)
Calcium: 8.6 mg/dL — ABNORMAL LOW (ref 8.9–10.3)
Chloride: 106 mmol/L (ref 101–111)
Creatinine, Ser: 0.74 mg/dL (ref 0.61–1.24)
Glucose, Bld: 108 mg/dL — ABNORMAL HIGH (ref 65–99)
POTASSIUM: 3.9 mmol/L (ref 3.5–5.1)
Sodium: 140 mmol/L (ref 135–145)
TOTAL PROTEIN: 7.5 g/dL (ref 6.5–8.1)

## 2017-01-25 LAB — CBC WITH DIFFERENTIAL/PLATELET
BASOS ABS: 0.1 10*3/uL (ref 0–0.1)
Basophils Relative: 1 %
EOS ABS: 0.1 10*3/uL (ref 0–0.7)
EOS PCT: 1 %
HCT: 36.7 % — ABNORMAL LOW (ref 40.0–52.0)
Hemoglobin: 13 g/dL (ref 13.0–18.0)
LYMPHS PCT: 30 %
Lymphs Abs: 1.5 10*3/uL (ref 1.0–3.6)
MCH: 33.9 pg (ref 26.0–34.0)
MCHC: 35.5 g/dL (ref 32.0–36.0)
MCV: 95.3 fL (ref 80.0–100.0)
Monocytes Absolute: 0.4 10*3/uL (ref 0.2–1.0)
Monocytes Relative: 7 %
NEUTROS PCT: 61 %
Neutro Abs: 3.1 10*3/uL (ref 1.4–6.5)
PLATELETS: 287 10*3/uL (ref 150–440)
RBC: 3.85 MIL/uL — AB (ref 4.40–5.90)
RDW: 15.4 % — ABNORMAL HIGH (ref 11.5–14.5)
WBC: 5.1 10*3/uL (ref 3.8–10.6)

## 2017-01-25 NOTE — Progress Notes (Signed)
Wyline Mood MD, MRCP(U.K) [address]  [address]  [place], [place] [address]  Main: [phone]  Fax: [phone]   Gastroenterology Consultation  Referring Provider:      Primary Care Physician:  Patient, No Pcp Per Primary Gastroenterologist:  Dr. Wyline Mood  Reason for Consultation:   Blood in the stool and abnormal LFT's        HPI:   PARAM CAPRI is a 56 y.o. y/o male .He has been referred for blood in the stool . I do note LFT 09/2016 elevated Alkaline phosphotase and AST. Last office note in 09/2016 suggests some difficulty swallowing and blood in his stools. Was also noted to have normocytic anemia at that time.    Says he only saw blood in his stools on one occasion and has had none since. Never had a colonoscopy. No family history of colon cancer. He is a smoker- "i dont smoke that much ", drinks alcohol beer- 3-4 bottles a day for the past for many years. Used to use cocaine- last use was a year back . Today he says he has no issues swallowing at all. Gained 8 lbs. Denies HIV, Hepatitis B/C.   Says he has oil on his stool when he has a bowel movement . H/o pancreatitis in the past on multiple occasions.   Past Medical History:  Diagnosis Date  . Anxiety   . Arthritis   . Depression   . GERD (gastroesophageal reflux disease)   . Hypertension   . Pancreatitis   . Pneumonia   . Substance abuse     Past Surgical History:  Procedure Laterality Date  . gallstone removal      Prior to Admission medications   Medication Sig Start Date End Date Taking? Authorizing Provider  allopurinol (ZYLOPRIM) 300 MG tablet Take 1 tablet (300 mg total) by mouth Two (2) times a day. 11/03/16   [provider]  ALPHAGAN P 0.1 % SOLN Administer 1 drop into the left eye Two (2) times a day. 11/03/16   [provider]  aspirin 81 MG chewable tablet CHEW ONE TABLET BY MOUTH DAILY 11/03/16   [provider]  atorvastatin (LIPITOR) 40 MG tablet Take 1 tablet  (40 mg total) by mouth daily. For cholesterol 11/03/16   [provider]  BISACODYL 5 MG EC tablet TAKE ONE TABLET BY MOUTH EVERY DAY AS NEEDED FOR CONSTIPATION 11/17/16   [provider]  celecoxib (CELEBREX) 200 MG capsule Take 1 capsule (200 mg total) by mouth daily as needed for pain. 11/03/16   [provider]  colchicine 0.6 MG tablet TAKE ONE TABLET BY MOUTH EVERY DAY FOR GOUT 11/03/16   [provider]  dorzolamide-timolol (COSOPT) 22.3-6.8 MG/ML ophthalmic solution Administer 1 drop to both eyes Two (2) times a day. 11/03/16   [provider]  esomeprazole (NEXIUM) 40 MG capsule TAKE ONE CAPSULE BY MOUTH EVERY MORNING BEFORE BREAKFAST 11/03/16   [provider]  fluticasone (FLONASE) 50 MCG/ACT nasal spray Place 2 sprays into both nostrils daily. 11/03/16   [provider]  polyethylene glycol powder (GLYCOLAX/MIRALAX) powder MIX 17 G WITH 8 OZ OF FLUID AND TAKE BY MOUTH ONCE DAILY AS NEEDED 11/03/16   [provider]  prednisoLONE acetate (PRED FORTE) 1 % ophthalmic suspension INSTILL 1 DROP IN RIGHT EYE 4 TIMES A DAY 01/20/17   [provider]  tamsulosin (FLOMAX) 0.4 MG CAPS capsule Take 1 capsule (0.4 mg total) by mouth daily. 11/03/16  [provider]  TRAVATAN Z 0.004 % SOLN ophthalmic solution Administer 1 drop to both eyes every evening. 11/03/16   [provider]  VOLTAREN 1 % GEL APPLY 2 GRAMS TO JOINTS 3 TO 4 TIMES DAILY AS NEEDED FOR PAIN 10/06/16   [provider]    Family History  Problem Relation Age of Onset  . Alcohol abuse Mother   . Arthritis Mother   . Hypertension Mother   . Varicose Veins Mother   . Alcohol abuse Father   . Cancer Father   . Early death Father   . Alcohol abuse Sister   . Cancer Sister   . COPD Sister   . Depression Sister   . Early death Sister   . Heart disease Sister   . Varicose Veins Sister   . Birth defects Brother   . Diabetes Brother   .  Learning disabilities Brother   . Mental illness Brother   . Mental retardation Brother   . Vision loss Brother      Social History  Substance Use Topics  . Smoking status: Current Every Day Smoker    Packs/day: 0.50    Years: 15.00    Types: Cigarettes  . Smokeless tobacco: Never Used  . Alcohol use 50.4 oz/week    84 Cans of beer per week     Comment: 40 oz four times/day    Allergies as of 01/25/2017  . (No Known Allergies)    Review of Systems:    All systems reviewed and negative except where noted in HPI.   Physical Exam:  BP 123/84 (BP Location: Left Arm, Patient Position: Sitting, Cuff Size: Normal)   Pulse 92   Temp 98.2 F (36.8 C) (Oral)   Ht 5\' 5"  (1.651 m)   Wt 121 lb 9.6 oz (55.2 kg)   BMI 20.24 kg/m  No LMP for male patient. Psych:  Alert and cooperative. Normal mood and affect. General:   Alert,  Well-developed, well-nourished, pleasant and cooperative in NAD Head:  Normocephalic and atraumatic. Eyes:  Sclera clear, no icterus.   Conjunctiva pink. Ears:  Normal auditory acuity. Nose:  No deformity, discharge, or lesions. Mouth:  No deformity or lesions,oropharynx pink & moist. Neck:  Supple; no masses or thyromegaly. Lungs:  Respirations even and unlabored.  Clear throughout to auscultation.   No wheezes, crackles, or rhonchi. No acute distress. Heart:  Regular rate and rhythm; no murmurs, clicks, rubs, or gallops. Abdomen:  Normal bowel sounds.  No bruits.  Soft, non-tender and non-distended without masses, hepatosplenomegaly or hernias noted.  No guarding or rebound tenderness.    Msk:  Symmetrical without gross deformities. Good, equal movement & strength bilaterally. Neurologic:  Alert and oriented x3;  grossly normal neurologically. Skin:  Intact without significant lesions or rashes. No jaundice. Lymph Nodes:  No significant cervical adenopathy. Psych:  Alert and cooperative. Normal mood and affect.  Imaging Studies: No results  found.  Assessment and Plan:   Alison Murrayhomas A Kolenovic is a 56 y.o. y/o male has been referred for blood in hi stool, abnormal LFT's . H/o alcohol and drug abuse.    Plan  1. CBC 2. RUQ USG and repeat LFT',CMP- if LFT's still elevated will need viral and autoimmune hepatitis panel to be checked  3. Diagnostic colonoscopy  4. Stop alcohol use/no smoking   I have discussed alternative options, risks & benefits,  which include, but are not limited to, bleeding, infection, perforation,respiratory complication & drug reaction.  The  patient agrees with this plan & written consent will be obtained   Follow up in 2-3 months   Dr Wyline Mood MD,MRCP(U.K) .

## 2017-01-27 ENCOUNTER — Telehealth: Payer: Self-pay

## 2017-01-27 NOTE — Telephone Encounter (Signed)
Sean Classdvised Robin of pt's US scheduled for Wednesday, 9/5 @915am  @ [facility]. NPO 6 hrs prior

## 2017-01-27 NOTE — Telephone Encounter (Signed)
-----   Message from Wyline MoodKiran Anna, MD sent at 01/27/2017 10:48 AM EDT ----- Regarding: RE: Scheduling USG is ok no to colonoscopy , inform patient and advise to get back in touch when he has stopped all lcocaine    ----- Message ----- From: Ethlyn Galleryarter, Kerrigan Glendening, CMA Sent: 01/27/2017  10:37 AM To: Wyline MoodKiran Anna, MD Subject: Scheduling                                     Pt's drug test is positive for cocaine. Other labs abnormal as well.   So I'm assuming we can't schedule his colonoscopy or USG yet.  Is that right?  Thanks, CarMaxPanya

## 2017-01-27 NOTE — Telephone Encounter (Signed)
LVM for Zella BallRobin to callback.  Relay information per Dr. Tobi BastosAnna concerning procedures and US.

## 2017-02-01 ENCOUNTER — Other Ambulatory Visit: Payer: Self-pay | Admitting: Gastroenterology

## 2017-02-01 ENCOUNTER — Ambulatory Visit: Payer: Medicaid Other

## 2017-02-06 ENCOUNTER — Ambulatory Visit
Admission: RE | Admit: 2017-02-06 | Discharge: 2017-02-06 | Disposition: A | Discharge status: Home / Self Care | Payer: Medicaid Other | Source: Ambulatory Visit | Attending: Gastroenterology | Admitting: Gastroenterology

## 2017-02-06 DIAGNOSIS — K838 Other specified diseases of biliary tract: Secondary | ICD-10-CM | POA: Insufficient documentation

## 2017-02-06 DIAGNOSIS — R7989 Other specified abnormal findings of blood chemistry: Secondary | ICD-10-CM

## 2017-02-06 DIAGNOSIS — R945 Abnormal results of liver function studies: Secondary | ICD-10-CM | POA: Diagnosis present

## 2017-02-14 ENCOUNTER — Telehealth: Payer: Self-pay

## 2017-02-14 NOTE — Telephone Encounter (Signed)
Called patient to advise of results per Dr. Tobi Bastos:  1. USG shows a stent in the CBD- does he know how [place] he has had it for and why? 10 - 15 years during gallstone removal  2. May be the cause of his abdominal pain and abnormal LFT's as it may be blocked   3. Would benefit from removal of stent but will need to be done when he has no cocaine in system:  4. Ask him to call us when he has stopped all cocaine for a month for [place] to schedule the possible ERCP   5. Needs a pcp if he does not have one   Patient states he will call us when he's ready to have the stent removed and has stopped cocaine for 1 month.

## 2018-03-03 ENCOUNTER — Emergency Department: Payer: Medicaid Other

## 2018-03-03 ENCOUNTER — Emergency Department: Payer: Medicaid Other | Admitting: Anesthesiology

## 2018-03-03 ENCOUNTER — Inpatient Hospital Stay
Admission: EM | Admit: 2018-03-03 | Discharge: 2018-03-07 | DRG: 853 | Disposition: A | Discharge status: Home / Self Care | Payer: Medicaid Other | Attending: Surgery | Admitting: Surgery

## 2018-03-03 ENCOUNTER — Other Ambulatory Visit: Payer: Self-pay

## 2018-03-03 ENCOUNTER — Encounter: Payer: Self-pay | Admitting: Emergency Medicine

## 2018-03-03 ENCOUNTER — Encounter
Admission: EM | Disposition: A | Discharge status: Home / Self Care | Payer: Self-pay | Source: Home / Self Care | Attending: Surgery

## 2018-03-03 DIAGNOSIS — Z821 Family history of blindness and visual loss: Secondary | ICD-10-CM

## 2018-03-03 DIAGNOSIS — Z825 Family history of asthma and other chronic lower respiratory diseases: Secondary | ICD-10-CM | POA: Diagnosis not present

## 2018-03-03 DIAGNOSIS — R109 Unspecified abdominal pain: Secondary | ICD-10-CM | POA: Diagnosis present

## 2018-03-03 DIAGNOSIS — E876 Hypokalemia: Secondary | ICD-10-CM | POA: Diagnosis present

## 2018-03-03 DIAGNOSIS — Z8261 Family history of arthritis: Secondary | ICD-10-CM | POA: Diagnosis not present

## 2018-03-03 DIAGNOSIS — Z811 Family history of alcohol abuse and dependence: Secondary | ICD-10-CM

## 2018-03-03 DIAGNOSIS — A419 Sepsis, unspecified organism: Secondary | ICD-10-CM | POA: Diagnosis present

## 2018-03-03 DIAGNOSIS — Z833 Family history of diabetes mellitus: Secondary | ICD-10-CM | POA: Diagnosis not present

## 2018-03-03 DIAGNOSIS — Z810 Family history of intellectual disabilities: Secondary | ICD-10-CM | POA: Diagnosis not present

## 2018-03-03 DIAGNOSIS — K650 Generalized (acute) peritonitis: Secondary | ICD-10-CM | POA: Diagnosis present

## 2018-03-03 DIAGNOSIS — Z8249 Family history of ischemic heart disease and other diseases of the circulatory system: Secondary | ICD-10-CM | POA: Diagnosis not present

## 2018-03-03 DIAGNOSIS — K255 Chronic or unspecified gastric ulcer with perforation: Secondary | ICD-10-CM | POA: Diagnosis present

## 2018-03-03 DIAGNOSIS — M1990 Unspecified osteoarthritis, unspecified site: Secondary | ICD-10-CM | POA: Diagnosis present

## 2018-03-03 DIAGNOSIS — K275 Chronic or unspecified peptic ulcer, site unspecified, with perforation: Secondary | ICD-10-CM | POA: Diagnosis present

## 2018-03-03 DIAGNOSIS — F17210 Nicotine dependence, cigarettes, uncomplicated: Secondary | ICD-10-CM | POA: Diagnosis present

## 2018-03-03 DIAGNOSIS — I10 Essential (primary) hypertension: Secondary | ICD-10-CM | POA: Diagnosis present

## 2018-03-03 DIAGNOSIS — Z23 Encounter for immunization: Secondary | ICD-10-CM

## 2018-03-03 DIAGNOSIS — K631 Perforation of intestine (nontraumatic): Secondary | ICD-10-CM

## 2018-03-03 DIAGNOSIS — Z7951 Long term (current) use of inhaled steroids: Secondary | ICD-10-CM | POA: Diagnosis not present

## 2018-03-03 DIAGNOSIS — Z818 Family history of other mental and behavioral disorders: Secondary | ICD-10-CM | POA: Diagnosis not present

## 2018-03-03 DIAGNOSIS — R188 Other ascites: Secondary | ICD-10-CM | POA: Diagnosis present

## 2018-03-03 DIAGNOSIS — Z7982 Long term (current) use of aspirin: Secondary | ICD-10-CM | POA: Diagnosis not present

## 2018-03-03 DIAGNOSIS — K219 Gastro-esophageal reflux disease without esophagitis: Secondary | ICD-10-CM | POA: Diagnosis present

## 2018-03-03 DIAGNOSIS — I959 Hypotension, unspecified: Secondary | ICD-10-CM | POA: Diagnosis present

## 2018-03-03 DIAGNOSIS — Z809 Family history of malignant neoplasm, unspecified: Secondary | ICD-10-CM

## 2018-03-03 HISTORY — PX: LAPAROTOMY: SHX154

## 2018-03-03 LAB — COMPREHENSIVE METABOLIC PANEL
ALK PHOS: 124 U/L (ref 38–126)
ALT: 25 U/L (ref 0–44)
ANION GAP: 9 (ref 5–15)
AST: 34 U/L (ref 15–41)
Albumin: 2.9 g/dL — ABNORMAL LOW (ref 3.5–5.0)
BILIRUBIN TOTAL: 0.4 mg/dL (ref 0.3–1.2)
BUN: 6 mg/dL (ref 6–20)
CALCIUM: 8.1 mg/dL — AB (ref 8.9–10.3)
CO2: 23 mmol/L (ref 22–32)
Chloride: 106 mmol/L (ref 98–111)
Creatinine, Ser: 1.03 mg/dL (ref 0.61–1.24)
GFR calc Af Amer: >60 mL/min (ref >60)
GLUCOSE: 104 mg/dL — AB (ref 70–99)
POTASSIUM: 2.9 mmol/L — AB (ref 3.5–5.1)
Sodium: 138 mmol/L (ref 135–145)
TOTAL PROTEIN: 6.1 g/dL — AB (ref 6.5–8.1)

## 2018-03-03 LAB — CBC
HEMATOCRIT: 36.2 % — AB (ref 40.0–52.0)
HEMOGLOBIN: 11.7 g/dL — AB (ref 13.0–18.0)
MCH: 28.5 pg (ref 26.0–34.0)
MCHC: 32.4 g/dL (ref 32.0–36.0)
MCV: 87.7 fL (ref 80.0–100.0)
Platelets: 424 10*3/uL (ref 150–440)
RBC: 4.12 MIL/uL — ABNORMAL LOW (ref 4.40–5.90)
RDW: 20.1 % — AB (ref 11.5–14.5)
WBC: 12.6 10*3/uL — ABNORMAL HIGH (ref 3.8–10.6)

## 2018-03-03 LAB — URINE DRUG SCREEN, QUALITATIVE (ARMC ONLY)
Amphetamines, Ur Screen: NOT DETECTED
BARBITURATES, UR SCREEN: NOT DETECTED
Benzodiazepine, Ur Scrn: NOT DETECTED
CANNABINOID 50 NG, UR ~~LOC~~: NOT DETECTED
COCAINE METABOLITE, UR ~~LOC~~: POSITIVE — AB
MDMA (ECSTASY) UR SCREEN: NOT DETECTED
Methadone Scn, Ur: NOT DETECTED
Opiate, Ur Screen: NOT DETECTED
Phencyclidine (PCP) Ur S: NOT DETECTED
TRICYCLIC, UR SCREEN: NOT DETECTED

## 2018-03-03 LAB — LIPASE, BLOOD: LIPASE: 16 U/L (ref 11–51)

## 2018-03-03 LAB — TROPONIN I: Troponin I: <0.03 ng/mL (ref <0.03)

## 2018-03-03 SURGERY — LAPAROTOMY, EXPLORATORY
Anesthesia: General

## 2018-03-03 MED ORDER — ONDANSETRON HCL 4 MG/2ML IJ SOLN
4.0000 mg | Freq: Once | INTRAMUSCULAR | Status: AC
  Administered 2018-03-03: 4 mg via INTRAVENOUS
  Filled 2018-03-03: qty 2

## 2018-03-03 MED ORDER — THIAMINE HCL 100 MG/ML IJ SOLN
Freq: Once | INTRAVENOUS | Status: DC
  Filled 2018-03-03: qty 1000

## 2018-03-03 MED ORDER — SUCCINYLCHOLINE CHLORIDE 20 MG/ML IJ SOLN
INTRAMUSCULAR | Status: DC | PRN
  Administered 2018-03-03: 100 mg via INTRAVENOUS

## 2018-03-03 MED ORDER — SODIUM CHLORIDE 0.9 % IV BOLUS
1000.0000 mL | Freq: Once | INTRAVENOUS | Status: AC
  Administered 2018-03-03: 1000 mL via INTRAVENOUS

## 2018-03-03 MED ORDER — GLYCOPYRROLATE 0.2 MG/ML IJ SOLN
INTRAMUSCULAR | Status: AC
  Filled 2018-03-03: qty 1

## 2018-03-03 MED ORDER — PROPOFOL 10 MG/ML IV BOLUS
INTRAVENOUS | Status: DC | PRN
  Administered 2018-03-03: 160 mg via INTRAVENOUS

## 2018-03-03 MED ORDER — FENTANYL CITRATE (PF) 100 MCG/2ML IJ SOLN
INTRAMUSCULAR | Status: AC
  Filled 2018-03-03: qty 2

## 2018-03-03 MED ORDER — BUPIVACAINE-EPINEPHRINE (PF) 0.25% -1:200000 IJ SOLN
INTRAMUSCULAR | Status: AC
  Filled 2018-03-03: qty 30

## 2018-03-03 MED ORDER — FENTANYL CITRATE (PF) 100 MCG/2ML IJ SOLN
INTRAMUSCULAR | Status: DC | PRN
  Administered 2018-03-03: 100 ug via INTRAVENOUS

## 2018-03-03 MED ORDER — LIDOCAINE HCL (PF) 2 % IJ SOLN
INTRAMUSCULAR | Status: AC
  Filled 2018-03-03: qty 10

## 2018-03-03 MED ORDER — SODIUM CHLORIDE 0.9 % IJ SOLN
INTRAMUSCULAR | Status: DC | PRN
  Administered 2018-03-03: 50 mL

## 2018-03-03 MED ORDER — HYDROMORPHONE HCL 1 MG/ML IJ SOLN
INTRAMUSCULAR | Status: AC
  Filled 2018-03-03: qty 1

## 2018-03-03 MED ORDER — DEXAMETHASONE SODIUM PHOSPHATE 10 MG/ML IJ SOLN
INTRAMUSCULAR | Status: AC
  Filled 2018-03-03: qty 1

## 2018-03-03 MED ORDER — BUPIVACAINE LIPOSOME 1.3 % IJ SUSP
INTRAMUSCULAR | Status: DC | PRN
  Administered 2018-03-03: 20 mL

## 2018-03-03 MED ORDER — FENTANYL CITRATE (PF) 100 MCG/2ML IJ SOLN: 25.0000 ug | INTRAMUSCULAR | Status: DC | PRN

## 2018-03-03 MED ORDER — KETOROLAC TROMETHAMINE 30 MG/ML IJ SOLN
INTRAMUSCULAR | Status: AC
  Filled 2018-03-03: qty 1

## 2018-03-03 MED ORDER — PROMETHAZINE HCL 25 MG/ML IJ SOLN: 6.2500 mg | INTRAMUSCULAR | Status: DC | PRN

## 2018-03-03 MED ORDER — PIPERACILLIN-TAZOBACTAM 3.375 G IVPB 30 MIN
3.3750 g | Freq: Once | INTRAVENOUS | Status: AC
  Administered 2018-03-03: 3.375 g via INTRAVENOUS
  Filled 2018-03-03: qty 50

## 2018-03-03 MED ORDER — LABETALOL HCL 5 MG/ML IV SOLN
5.0000 mg | INTRAVENOUS | Status: AC | PRN
  Administered 2018-03-03 (×4): 5 mg via INTRAVENOUS

## 2018-03-03 MED ORDER — SODIUM CHLORIDE 0.9 % IV SOLN
INTRAVENOUS | Status: DC | PRN
  Administered 2018-03-03: 20:00:00 via INTRAVENOUS

## 2018-03-03 MED ORDER — HYDROMORPHONE HCL 1 MG/ML IJ SOLN
1.0000 mg | Freq: Once | INTRAMUSCULAR | Status: AC
  Administered 2018-03-03: 1 mg via INTRAVENOUS
  Filled 2018-03-03: qty 1

## 2018-03-03 MED ORDER — DEXAMETHASONE SODIUM PHOSPHATE 10 MG/ML IJ SOLN
INTRAMUSCULAR | Status: DC | PRN
  Administered 2018-03-03: 10 mg via INTRAVENOUS

## 2018-03-03 MED ORDER — ROCURONIUM BROMIDE 50 MG/5ML IV SOLN
INTRAVENOUS | Status: AC
  Filled 2018-03-03: qty 1

## 2018-03-03 MED ORDER — ONDANSETRON HCL 4 MG/2ML IJ SOLN
INTRAMUSCULAR | Status: AC
  Filled 2018-03-03: qty 2

## 2018-03-03 MED ORDER — KETOROLAC TROMETHAMINE 30 MG/ML IJ SOLN
30.0000 mg | Freq: Once | INTRAMUSCULAR | Status: AC | PRN
  Administered 2018-03-03: 30 mg via INTRAVENOUS

## 2018-03-03 MED ORDER — SUGAMMADEX SODIUM 200 MG/2ML IV SOLN
INTRAVENOUS | Status: AC
  Filled 2018-03-03: qty 2

## 2018-03-03 MED ORDER — SODIUM CHLORIDE FLUSH 0.9 % IV SOLN
INTRAVENOUS | Status: AC
  Filled 2018-03-03: qty 50

## 2018-03-03 MED ORDER — MIDAZOLAM HCL 2 MG/2ML IJ SOLN
INTRAMUSCULAR | Status: DC | PRN
  Administered 2018-03-03: 2 mg via INTRAVENOUS

## 2018-03-03 MED ORDER — PHENYLEPHRINE HCL 10 MG/ML IJ SOLN
INTRAMUSCULAR | Status: DC | PRN
  Administered 2018-03-03: 200 ug via INTRAVENOUS
  Administered 2018-03-03: 300 ug via INTRAVENOUS
  Administered 2018-03-03: 200 ug via INTRAVENOUS
  Administered 2018-03-03: 100 ug via INTRAVENOUS

## 2018-03-03 MED ORDER — BUPIVACAINE LIPOSOME 1.3 % IJ SUSP
INTRAMUSCULAR | Status: AC
  Filled 2018-03-03: qty 20

## 2018-03-03 MED ORDER — ROCURONIUM BROMIDE 100 MG/10ML IV SOLN
INTRAVENOUS | Status: DC | PRN
  Administered 2018-03-03: 20 mg via INTRAVENOUS

## 2018-03-03 MED ORDER — HYDROMORPHONE HCL 1 MG/ML IJ SOLN
INTRAMUSCULAR | Status: AC
  Administered 2018-03-03: 0.25 mg via INTRAVENOUS
  Filled 2018-03-03: qty 1

## 2018-03-03 MED ORDER — SUCCINYLCHOLINE CHLORIDE 20 MG/ML IJ SOLN
INTRAMUSCULAR | Status: AC
  Filled 2018-03-03: qty 1

## 2018-03-03 MED ORDER — MIDAZOLAM HCL 2 MG/2ML IJ SOLN
INTRAMUSCULAR | Status: AC
  Filled 2018-03-03: qty 2

## 2018-03-03 MED ORDER — IOPAMIDOL (ISOVUE-300) INJECTION 61%
100.0000 mL | Freq: Once | INTRAVENOUS | Status: AC | PRN
  Administered 2018-03-03: 100 mL via INTRAVENOUS

## 2018-03-03 MED ORDER — HYDROMORPHONE HCL 1 MG/ML IJ SOLN
0.2500 mg | INTRAMUSCULAR | Status: DC | PRN
  Administered 2018-03-03 (×2): 0.25 mg via INTRAVENOUS

## 2018-03-03 MED ORDER — SUGAMMADEX SODIUM 500 MG/5ML IV SOLN
INTRAVENOUS | Status: DC | PRN
  Administered 2018-03-03: 150 mg via INTRAVENOUS

## 2018-03-03 MED ORDER — POTASSIUM CHLORIDE 10 MEQ/100ML IV SOLN
10.0000 meq | INTRAVENOUS | Status: AC
  Administered 2018-03-03 (×4): 10 meq via INTRAVENOUS
  Filled 2018-03-03 (×4): qty 100

## 2018-03-03 MED ORDER — BUPIVACAINE-EPINEPHRINE (PF) 0.25% -1:200000 IJ SOLN
INTRAMUSCULAR | Status: DC | PRN
  Administered 2018-03-03: 30 mL

## 2018-03-03 MED ORDER — PROPOFOL 10 MG/ML IV BOLUS
INTRAVENOUS | Status: AC
  Filled 2018-03-03: qty 20

## 2018-03-03 MED ORDER — LIDOCAINE HCL (CARDIAC) PF 100 MG/5ML IV SOSY
PREFILLED_SYRINGE | INTRAVENOUS | Status: DC | PRN
  Administered 2018-03-03: 60 mg via INTRAVENOUS

## 2018-03-03 MED ORDER — ONDANSETRON HCL 4 MG/2ML IJ SOLN
INTRAMUSCULAR | Status: DC | PRN
  Administered 2018-03-03: 4 mg via INTRAVENOUS

## 2018-03-03 MED ORDER — HYDROMORPHONE HCL 1 MG/ML IJ SOLN
1.0000 mg | Freq: Once | INTRAMUSCULAR | Status: AC
  Administered 2018-03-03: 1 mg via INTRAVENOUS

## 2018-03-03 MED ORDER — LABETALOL HCL 5 MG/ML IV SOLN
INTRAVENOUS | Status: AC
  Administered 2018-03-03: 5 mg via INTRAVENOUS
  Filled 2018-03-03: qty 4

## 2018-03-03 SURGICAL SUPPLY — 48 items
APPLIER CLIP 11 MED OPEN (CLIP)
APPLIER CLIP 13 LRG OPEN (CLIP)
BARRIER ADH SEPRAFILM 3INX5IN (MISCELLANEOUS) IMPLANT
BLADE CLIPPER SURG (BLADE) ×2 IMPLANT
BLADE SURG SZ10 CARB STEEL (BLADE) ×2 IMPLANT
BULB RESERV EVAC DRAIN JP 100C (MISCELLANEOUS) ×2 IMPLANT
CANISTER SUCT 3000ML PPV (MISCELLANEOUS) ×4 IMPLANT
CHLORAPREP W/TINT 26ML (MISCELLANEOUS) ×2 IMPLANT
CLIP APPLIE 11 MED OPEN (CLIP) IMPLANT
CLIP APPLIE 13 LRG OPEN (CLIP) IMPLANT
DRAIN CHANNEL 19F RND (DRAIN) ×2 IMPLANT
DRAPE LAPAROTOMY 100X77 ABD (DRAPES) ×2 IMPLANT
DRAPE TABLE BACK 80X90 (DRAPES) IMPLANT
DRSG OPSITE POSTOP 4X8 (GAUZE/BANDAGES/DRESSINGS) ×2 IMPLANT
DRSG TEGADERM 2-3/8X2-3/4 SM (GAUZE/BANDAGES/DRESSINGS) IMPLANT
DRSG TELFA 3X8 NADH (GAUZE/BANDAGES/DRESSINGS) IMPLANT
ELECT BLADE 6.5 EXT (BLADE) ×2 IMPLANT
ELECT REM PT RETURN 9FT ADLT (ELECTROSURGICAL) ×2
ELECTRODE REM PT RTRN 9FT ADLT (ELECTROSURGICAL) ×1 IMPLANT
GAUZE 4X4 16PLY RFD (DISPOSABLE) IMPLANT
GLOVE BIO SURGEON STRL SZ7 (GLOVE) ×2 IMPLANT
GOWN STRL REUS W/ TWL LRG LVL3 (GOWN DISPOSABLE) ×2 IMPLANT
GOWN STRL REUS W/TWL LRG LVL3 (GOWN DISPOSABLE) ×2
HANDLE SUCTION POOLE (INSTRUMENTS) ×1 IMPLANT
HANDLE YANKAUER SUCT BULB TIP (MISCELLANEOUS) ×2 IMPLANT
LIGASURE IMPACT 36 18CM CVD LR (INSTRUMENTS) IMPLANT
NEEDLE HYPO 22GX1.5 SAFETY (NEEDLE) ×4 IMPLANT
NEEDLE HYPO 25X1 1.5 SAFETY (NEEDLE) ×2 IMPLANT
PACK BASIN MAJOR ARMC (MISCELLANEOUS) ×2 IMPLANT
RELOAD PROXIMATE 75MM BLUE (ENDOMECHANICALS) IMPLANT
SPONGE DRAIN TRACH 4X4 STRL 2S (GAUZE/BANDAGES/DRESSINGS) ×2 IMPLANT
SPONGE LAP 18X18 RF (DISPOSABLE) ×2 IMPLANT
SPONGE LAP 18X36 RFD (DISPOSABLE) ×2 IMPLANT
STAPLER PROXIMATE 75MM BLUE (STAPLE) IMPLANT
STAPLER SKIN PROX 35W (STAPLE) ×2 IMPLANT
SUCTION POOLE HANDLE (INSTRUMENTS) ×2
SUT PDS AB 0 CT1 27 (SUTURE) ×6 IMPLANT
SUT SILK 2 0 (SUTURE) ×1
SUT SILK 2 0 SH CR/8 (SUTURE) ×2 IMPLANT
SUT SILK 2 0SH CR/8 30 (SUTURE) ×2 IMPLANT
SUT SILK 2-0 18XBRD TIE 12 (SUTURE) ×1 IMPLANT
SUT VIC AB 0 CT1 36 (SUTURE) ×4 IMPLANT
SUT VIC AB 2-0 SH 27 (SUTURE) ×2
SUT VIC AB 2-0 SH 27XBRD (SUTURE) ×2 IMPLANT
SYR 30ML LL (SYRINGE) ×4 IMPLANT
SYR 3ML LL SCALE MARK (SYRINGE) ×2 IMPLANT
TAPE MICROFOAM 4IN (TAPE) ×2 IMPLANT
TRAY FOLEY MTR SLVR 16FR STAT (SET/KITS/TRAYS/PACK) ×2 IMPLANT

## 2018-03-03 NOTE — Anesthesia Post-op Follow-up Note (Signed)
Anesthesia QCDR form completed.        

## 2018-03-03 NOTE — Anesthesia Postprocedure Evaluation (Signed)
Anesthesia Post Note  Patient: Sean [name]  Procedure(s) Performed: EXPLORATORY LAPAROTOMY with  graham patch (N/A )  Patient location during evaluation: PACU Anesthesia Type: General Level of consciousness: awake and alert Pain management: pain level controlled Vital Signs Assessment: post-procedure vital signs reviewed and stable Respiratory status: spontaneous breathing, nonlabored ventilation and respiratory function stable Cardiovascular status: stable Postop Assessment: no apparent nausea or vomiting Anesthetic complications: no     Last Vitals:  Vitals:   03/03/18 2308 03/03/18 2318  BP: (!) 130/104 (!) 129/102  Pulse: 77 74  Resp: (!) 9 11  Temp:    SpO2: 97% 99%    Last Pain:  Vitals:   03/03/18 2318  TempSrc:   PainSc: Asleep                 Jovita Gamma

## 2018-03-03 NOTE — ED Notes (Signed)
Patient placed on 2L O2 via [place] for pulse ox of 88%.

## 2018-03-03 NOTE — Anesthesia Procedure Notes (Signed)
Procedure Name: Intubation Date/Time: 03/03/2018 8:31 PM Performed by: Ginger Carne, CRNA Pre-anesthesia Checklist: Patient identified, Emergency Drugs available, Suction available, Patient being monitored and Timeout performed Patient Re-evaluated:Patient Re-evaluated prior to induction Oxygen Delivery Method: Circle system utilized Preoxygenation: Pre-oxygenation with 100% oxygen Induction Type: IV induction, Rapid sequence and Cricoid Pressure applied Ventilation: Mask ventilation without difficulty Laryngoscope Size: Miller and 2 Grade View: Grade I Tube type: Oral Tube size: 7.5 mm Number of attempts: 1 Airway Equipment and Method: Stylet Placement Confirmation: ETT inserted through vocal cords under direct vision,  positive ETCO2 and breath sounds checked- equal and bilateral Secured at: 21 cm Tube secured with: Tape Dental Injury: Teeth and Oropharynx as per pre-operative assessment

## 2018-03-03 NOTE — Transfer of Care (Signed)
Immediate Anesthesia Transfer of Care Note  Patient: Sean [name]  Procedure(s) Performed: EXPLORATORY LAPAROTOMY with  graham patch (N/A )  Patient Location: PACU  Anesthesia Type:General  Level of Consciousness: awake, alert  and oriented  Airway & Oxygen Therapy: Patient Spontanous Breathing and Patient connected to face mask oxygen  Post-op Assessment: Report given to RN and Post -op Vital signs reviewed and stable  Post vital signs: Reviewed and stable  Last Vitals:  Vitals Value Taken Time  BP 163/119 03/03/2018  9:50 PM  Temp 36.3 C 03/03/2018  9:50 PM  Pulse 97 03/03/2018  9:54 PM  Resp 16 03/03/2018  9:54 PM  SpO2 97 % 03/03/2018  9:54 PM  Vitals shown include unvalidated device data.  Last Pain:  Vitals:   03/03/18 1912  TempSrc:   PainSc: 8          Complications: No apparent anesthesia complications

## 2018-03-03 NOTE — ED Notes (Signed)
Patient taken to CT scan.

## 2018-03-03 NOTE — ED Notes (Signed)
Patient assisted in use of urinal.

## 2018-03-03 NOTE — H&P (Signed)
Patient ID: Sean [name], male   DOB: March 17, 1961, 57 y.o.   MRN: [id number]  HPI Sean [name] is a 57 y.o. male w acute onsent of severe epigastric pain, pain is constant, sharp and worsening w movement. Some nausea but no emesis. He took some Sports administrator. And he takes "goodys" a few times a week and he drinks about 2-3 40 oz beers. CT pers reviewed showing free air and ascites. Likely from stomach. Wbc 12 , creat1. Coke + . Hypotensive in the Er and responded to fluids. Smokes daily. No previous abd operations   HPI  Past Medical History:  Diagnosis Date  . Anxiety   . Arthritis   . Depression   . GERD (gastroesophageal reflux disease)   . Hypertension   . Pancreatitis   . Pneumonia   . Substance abuse [facility])     Past Surgical History:  Procedure Laterality Date  . gallstone removal      Family History  Problem Relation Age of Onset  . Alcohol abuse Mother   . Arthritis Mother   . Hypertension Mother   . Varicose Veins Mother   . Alcohol abuse Father   . Cancer Father   . Early death Father   . Alcohol abuse Sister   . Cancer Sister   . COPD Sister   . Depression Sister   . Early death Sister   . Heart disease Sister   . Varicose Veins Sister   . Birth defects Brother   . Diabetes Brother   . Learning disabilities Brother   . Mental illness Brother   . Mental retardation Brother   . Vision loss Brother     Social History Social History   Tobacco Use  . Smoking status: Current Every Day Smoker    Packs/day: 0.50    Years: 15.00    Pack years: 7.50    Types: Cigarettes  . Smokeless tobacco: Never Used  Substance Use Topics  . Alcohol use: Yes    Alcohol/week: 84.0 standard drinks    Types: 84 Cans of beer per week    Comment: 40 oz four times/day  . Drug use: Yes    Types: Cocaine, Marijuana    Comment: Infrequent.    No Known Allergies  Current Facility-Administered Medications  Medication Dose Route Frequency Provider Last Rate Last Dose  .  potassium chloride 10 mEq in 100 mL IVPB  10 mEq Intravenous Q1 Hr x 4 Norman, Anne-Caroline, MD      . sodium chloride 0.9 % 1,000 mL with thiamine 100 mg, folic acid 1 mg, multivitamins adult 10 mL infusion   Intravenous Once Rockne Menghini, MD       Current Outpatient Medications  Medication Sig Dispense Refill  . allopurinol (ZYLOPRIM) 300 MG tablet Take 1 tablet (300 mg total) by mouth Two (2) times a day.  3  . ALPHAGAN P 0.1 % SOLN Administer 1 drop into the left eye Two (2) times a day.  99  . aspirin 81 MG chewable tablet CHEW ONE TABLET BY MOUTH DAILY  3  . atorvastatin (LIPITOR) 40 MG tablet Take 1 tablet (40 mg total) by mouth daily. For cholesterol  3  . BISACODYL 5 MG EC tablet TAKE ONE TABLET BY MOUTH EVERY DAY AS NEEDED FOR CONSTIPATION  11  . celecoxib (CELEBREX) 200 MG capsule Take 1 capsule (200 mg total) by mouth daily as needed for pain.  3  . colchicine 0.6 MG tablet TAKE ONE TABLET  BY MOUTH EVERY DAY FOR GOUT  3  . dorzolamide-timolol (COSOPT) 22.3-6.8 MG/ML ophthalmic solution Administer 1 drop to both eyes Two (2) times a day.  99  . esomeprazole (NEXIUM) 40 MG capsule TAKE ONE CAPSULE BY MOUTH EVERY MORNING BEFORE BREAKFAST  3  . fluticasone (FLONASE) 50 MCG/ACT nasal spray Place 2 sprays into both nostrils daily.  11  . polyethylene glycol powder (GLYCOLAX/MIRALAX) powder MIX 17 G WITH 8 OZ OF FLUID AND TAKE BY MOUTH ONCE DAILY AS NEEDED  5  . prednisoLONE acetate (PRED FORTE) 1 % ophthalmic suspension INSTILL 1 DROP IN RIGHT EYE 4 TIMES A DAY  1  . tamsulosin (FLOMAX) 0.4 MG CAPS capsule Take 1 capsule (0.4 mg total) by mouth daily.  3  . TRAVATAN Z 0.004 % SOLN ophthalmic solution Administer 1 drop to both eyes every evening.  99  . VOLTAREN 1 % GEL APPLY 2 GRAMS TO JOINTS 3 TO 4 TIMES DAILY AS NEEDED FOR PAIN  3     Review of Systems Full ROS  was asked and was negative except for the information on the HPI  Physical Exam Blood pressure (!) 159/97,  pulse 95, temperature (!) 97.5 F (36.4 C), temperature source Oral, resp. rate 20, height 5\' 11"  (1.803 m), weight 59 kg, SpO2 97 %. CONSTITUTIONAL: PT diaphoretic EYES: Pupils are equal, round, and reactive to light, Sclera are non-icteric. EARS, NOSE, MOUTH AND THROAT: The oropharynx is clear. The oral mucosa is pink and moist. Hearing is intact to voice. LYMPH NODES:  Lymph nodes in the neck are normal. RESPIRATORY:  Lungs are clear. There is normal respiratory effort, with equal breath sounds bilaterally, and without pathologic use of accessory muscles. CARDIOVASCULAR: Heart is regular without murmurs, gallops, or rubs. GI: The abdomen is exquisitely tender to palpation w + rebound and peritonitis. GU: Rectal deferred.   MUSCULOSKELETAL: Normal muscle strength and tone. No cyanosis or edema.   SKIN: Turgor is good and there are no pathologic skin lesions or ulcers. NEUROLOGIC: Motor and sensation is grossly normal. Cranial nerves are grossly intact. PSYCH:  Oriented to person, place and time. Affect is normal.  Data Reviewed  I have personally reviewed the patient's imaging, laboratory findings and medical records.    Assessment/Plan 57 yo w acute abdomen and peritonitis with Transient HD compromise. CT w free air. D/W the pt in detail about his disease process. D/w the pt about proceeding to the OR for laparotomy. Risks, benefits and possible complications including but not limited to bleeding, infection, anesthesia complications, re interventions bowel injuries, pain and even death. HE understands and wishes to proceed. He is peritonitic and septic and this is a true surgical . HE is a high risk for DT and perioperative complications given his malnutrition and ETOH abuse. We will resuscitate and start IV a/bs he is posted for emergency laparotomy  Sterling Big, MD FACS General Surgeon 03/03/2018, 8:03 PM

## 2018-03-03 NOTE — ED Notes (Signed)
Pharmacy called and will send fluids and potassium to same day surgery. OR informed.

## 2018-03-03 NOTE — Anesthesia Preprocedure Evaluation (Addendum)
Anesthesia Evaluation  Patient identified by MRN, date of birth, ID band Patient awake    Reviewed: Allergy & Precautions, H&P , NPO status , Patient's Chart, lab work & pertinent test results  Airway Mallampati: III   Neck ROM: limited   Comment: TM 2-3 FB Dental  (+) Chipped, Missing, Poor Dentition   Pulmonary Current Smoker,    breath sounds clear to auscultation       Cardiovascular hypertension,  Rhythm:regular Rate:Normal     Neuro/Psych PSYCHIATRIC DISORDERS Anxiety Depression negative neurological ROS     GI/Hepatic negative GI ROS, Neg liver ROS, GERD  ,(+)     substance abuse  alcohol use, cocaine use and marijuana use,   Endo/Other  negative endocrine ROS  Renal/GU      Musculoskeletal  (+) Arthritis ,   Abdominal   Peds  Hematology negative hematology ROS (+)   Anesthesia Other Findings Past Medical History: No date: Anxiety No date: Arthritis No date: Depression No date: GERD (gastroesophageal reflux disease) No date: Hypertension No date: Pancreatitis No date: Pneumonia No date: Substance abuse (HCC)  Past Surgical History: No date: gallstone removal  BMI    Body Mass Index:  18.13 kg/m      Reproductive/Obstetrics negative OB ROS                            Anesthesia Physical Anesthesia Plan  ASA: III and emergent  Anesthesia Plan: General ETT   Post-op Pain Management:    Induction:   PONV Risk Score and Plan: Ondansetron, Dexamethasone and Midazolam  Airway Management Planned:   Additional Equipment:   Intra-op Plan:   Post-operative Plan:   Informed Consent: I have reviewed the patients History and Physical, chart, labs and discussed the procedure including the risks, benefits and alternatives for the proposed anesthesia with the patient or authorized representative who has indicated his/her understanding and acceptance.   Dental Advisory  Given  Plan Discussed with: Anesthesiologist, CRNA and Surgeon  Anesthesia Plan Comments:         Anesthesia Quick Evaluation

## 2018-03-03 NOTE — Progress Notes (Signed)
CODE SEPSIS - PHARMACY COMMUNICATION  **Broad Spectrum Antibiotics should be administered within 1 hour of Sepsis diagnosis**  Time Code Sepsis Called/Page Received: 19:12  Antibiotics Ordered: Zosyn  Time of 1st antibiotic administration: 18:53  Additional action taken by pharmacy: n/a  If necessary, Name of Provider/Nurse Contacted: n/a    Foye Deer ,PharmD Clinical Pharmacist  03/03/2018  7:36 PM

## 2018-03-03 NOTE — ED Notes (Signed)
Report given to [facility] RN in the OR who states they will obtain consent.

## 2018-03-03 NOTE — ED Triage Notes (Signed)
Per EMS report, patient c/o severe abdominal pain in all 4 quadrants that began before patient had lunch. Patient reports a regular Bowel movement this morning. Patient is tender to touch. Per patient's mother whom he lives with, patient drinks alcohol all day [name]. Patient states he has an alcoholic drink a day.

## 2018-03-03 NOTE — Op Note (Addendum)
PROCEDURES: Laparotomy Repair of perforated ulcer with a Graham patch and 19 Fr blake placement  Pre-operative Diagnosis: perforated ulcer  Post-operative Diagnosis: same  Surgeon: Merri Ray Kenly Xiao    Anesthesia: General endotracheal anesthesia  ASA Class: 3   Surgeon: Sterling Big , MD FACS  Anesthesia: Gen. with endotracheal tube  Findings: Prepyloric ulcer with purulent peritonitis No evidence of leak after repair  Estimated Blood Loss: 25cc         Drains: 19 FR Blake          Complications: none              Condition: stable  Procedure Details  The patient was seen again in the Holding Room. The benefits, complications, treatment options, and expected outcomes were discussed with the patient. The risks of bleeding, infection, recurrence of symptoms, failure to resolve symptoms,  bowel injury, any of which could require further surgery were reviewed with the patient.   The patient was taken to Operating Room, identified as Sean [name] and the procedure verified.  A Time Out was held and the above information confirmed.  Prior to the induction of general anesthesia, antibiotic prophylaxis was administered. VTE prophylaxis was in place. General endotracheal anesthesia was then administered and tolerated well. After the induction, the abdomen was prepped with Chloraprep and draped in the sterile fashion. The patient was positioned in the supine position.  Upper midline laparotomy was performed using a 10 blade knife and electrocautery was used to dissect through subcutaneous tissue.  The fascia was incised and elevated.  The abdominal cavity was entered under direct visualization with visualization of  Obvious pnuemoperitoneum, there was also at least a liter  1/2 of purulent fluid. A retractor was placed and we identified the area of perforation in the prep allergic area.  We placed 3 interrupted silk sutures and created an omental flap from the greater omentum. We used ligasure  to dissect the pedicle and the pedicle was used as a   A Graham patch to close and reinforce the repair in the standard fashion.  There was no evidence of any leak.  We proceeded to run the bowel with no evidence of any injuries or any other perforations.  There was no other evidence of perforation in the posterior wall.  Liver did not look cirrhotic.  Spleen was normal. 19 Blake drain was placed on top of the repair.  We closed thefascia with a 0 PDS suture in a running fashion and the skin was closed withstaplesl. Liposomal Marcaine was injected on all incision sites under direct visualization. Needle and laparotomy count were correct and there were no immediate complications.  Sterling Big, MD, FACS

## 2018-03-03 NOTE — ED Provider Notes (Addendum)
[facility] Emergency Department Provider Note  ____________________________________________  Time seen: Approximately 5:12 PM  I have reviewed the triage vital signs and the nursing notes.   HISTORY  Chief Complaint Abdominal Pain    HPI Sean [name] is a 57 y.o. male a history of daily alcohol use, pancreatitis, GERD, presenting with severe abdominal pain.  The patient reports that around 12:30 PM today, he developed a severe pain diffusely in the abdomen.  He denies any nausea vomiting or diarrhea, fevers or chills, urinary symptoms.  He denies any chest pain or shortness of breath.  He tried an Alka-Seltzer for his pain, which did not help.  Upon arrival, the patient was noted to be hypotensive to the 70s over 50s, with a normal heart rate and a blood sugar in the 120s.  Past Medical History:  Diagnosis Date  . Anxiety   . Arthritis   . Depression   . GERD (gastroesophageal reflux disease)   . Hypertension   . Pancreatitis   . Pneumonia   . Substance abuse [facility])     Patient Active Problem List   Diagnosis Date Noted  . Pancreatitis   . Abnormal LFTs 02/19/2014    Past Surgical History:  Procedure Laterality Date  . gallstone removal      Current Outpatient Rx  . Order #: [id number] Class: Historical Med  . Order #: [id number] Class: Historical Med  . Order #: [id number] Class: Historical Med  . Order #: [id number] Class: Historical Med  . Order #: [id number] Class: Historical Med  . Order #: [id number] Class: Historical Med  . Order #: [id number] Class: Historical Med  . Order #: [id number] Class: Historical Med  . Order #: [id number] Class: Historical Med  . Order #: [id number] Class: Historical Med  . Order #: [id number] Class: Historical Med  . Order #: [id number] Class: Historical Med  . Order #: [id number] Class: Historical Med  . Order #: [id number] Class: Historical Med  . Order #: [id number] Class: Historical Med    Allergies Patient has no known  allergies.  Family History  Problem Relation Age of Onset  . Alcohol abuse Mother   . Arthritis Mother   . Hypertension Mother   . Varicose Veins Mother   . Alcohol abuse Father   . Cancer Father   . Early death Father   . Alcohol abuse Sister   . Cancer Sister   . COPD Sister   . Depression Sister   . Early death Sister   . Heart disease Sister   . Varicose Veins Sister   . Birth defects Brother   . Diabetes Brother   . Learning disabilities Brother   . Mental illness Brother   . Mental retardation Brother   . Vision loss Brother     Social History Social History   Tobacco Use  . Smoking status: Current Every Day Smoker    Packs/day: 0.50    Years: 15.00    Pack years: 7.50    Types: Cigarettes  . Smokeless tobacco: Never Used  Substance Use Topics  . Alcohol use: Yes    Alcohol/week: 84.0 standard drinks    Types: 84 Cans of beer per week    Comment: 40 oz four times/day  . Drug use: Yes    Types: Cocaine, Marijuana    Comment: Infrequent.    Review of Systems Constitutional: No fever/chills.  No lightheadedness or syncope. Eyes: No visual changes. ENT: No sore throat. No congestion or rhinorrhea. Cardiovascular: Denies chest pain. Denies palpitations. Respiratory: Denies shortness of breath.  No  cough. Gastrointestinal: Positive diffuse abdominal pain.  No nausea, no vomiting.  No diarrhea.  No constipation. Genitourinary: Negative for dysuria.  No urinary frequency. Musculoskeletal: Negative for back pain. Skin: Negative for rash. Neurological: Negative for headaches. No focal numbness, tingling or weakness.  Psych: Daily EtOH use.    ____________________________________________   PHYSICAL EXAM:  VITAL SIGNS: ED Triage Vitals  Enc Vitals Group     BP      Pulse      Resp      Temp      Temp src      SpO2      Weight      Height      Head Circumference      Peak Flow      Pain Score      Pain Loc      Pain Edu?      Excl. in GC?      Constitutional: Alert and oriented. Answers questions appropriately.  Chronically ill appearing and uncomfortable; nontoxic. Eyes: Conjunctivae are normal.  EOMI. positive scleral icterus. Head: Atraumatic. Nose: No congestion/rhinnorhea. Mouth/Throat: Mucous membranes are dry.  Neck: No stridor.  Supple.  Positive JVD.  No meningismus. Cardiovascular: Normal rate, regular rhythm. No murmurs, rubs or gallops.  Respiratory: Normal respiratory effort.  No accessory muscle use or retractions. Lungs CTAB.  No wheezes, rales or ronchi. Gastrointestinal: Soft, and nondistended.  Diffusely tender in the left upper quadrant and left lower quadrant with severe tenderness to palpation over the epigastrium.  Right upper quadrant is without tenderness to palpation; negative Murphy sign.  No guarding or rebound.  No peritoneal signs. Musculoskeletal: No LE edema.  Neurologic:  A&Ox3.  Speech is clear.  Face and smile are symmetric.  EOMI.  Moves all extremities well. Skin:  Skin is warm, dry and intact. No rash noted. Psychiatric: Mood and affect are normal. Speech and behavior are normal.  Normal judgement.  ____________________________________________   LABS (all labs ordered are listed, but only abnormal results are displayed)  Labs Reviewed  CBC - Abnormal; Notable for the following components:      Result Value   WBC 12.6 (*)    RBC 4.12 (*)    Hemoglobin 11.7 (*)    HCT 36.2 (*)    RDW 20.1 (*)    All other components within normal limits  COMPREHENSIVE METABOLIC PANEL - Abnormal; Notable for the following components:   Potassium 2.9 (*)    Glucose, Bld 104 (*)    Calcium 8.1 (*)    Total Protein 6.1 (*)    Albumin 2.9 (*)    All other components within normal limits  CULTURE, BLOOD (ROUTINE X 2)  CULTURE, BLOOD (ROUTINE X 2)  URINE CULTURE  LIPASE, BLOOD  TROPONIN I  LACTIC ACID, PLASMA  LACTIC ACID, PLASMA   ____________________________________________  EKG  ED ECG  REPORT I, Anne-Caroline Sharma Covert, the attending physician, personally viewed and interpreted this ECG.   Date: 03/03/2018  EKG Time: 1730  Rate: 75  Rhythm: normal sinus rhythm  Axis: normal  Intervals:prolonged Qtc  ST&T Change: No STEMI  ____________________________________________  RADIOLOGY  Ct Abdomen Pelvis W Contrast  Result Date: 03/03/2018 CLINICAL DATA:  58 year old male presents with severe abdominal pain starting today more focal in the epigastric region upon clinical exam per discussion with Dr. Sharma Covert. EXAM: CT ABDOMEN AND PELVIS WITH CONTRAST TECHNIQUE: Multidetector CT imaging of the abdomen and pelvis was performed using the standard protocol following bolus administration  of intravenous contrast. CONTRAST:  ISOVUE-300 IOPAMIDOL (ISOVUE-300) INJECTION 61% COMPARISON:  Same day CXR FINDINGS: Lower chest: Top-normal sized heart without pericardial effusion. Subsegmental atelectasis at each lung base. No effusion or pneumothorax. Hepatobiliary: Cholecystectomy. No enhancing hepatic mass. Periportal fluid is noted likely an extension the free fluid seen within the abdomen pelvis. Pancreas: Atrophic and densely calcified compatible with chronic pancreatitis. Spleen: No splenomegaly, mass or laceration. Adrenals/Urinary Tract: Normal bilateral adrenal glands. Symmetric cortical enhancement of both kidneys. No obstructive uropathy. The urinary bladder is unremarkable. Stomach/Bowel: Small hiatal hernia. Moderate fluid-filled distention of the stomach. Normal small bowel rotation without mechanical bowel obstruction. Moderate stool retention within the right colon. No definite annular constricting lesion. Probable mild sympathetic thickening of the descending colon through rectum. What appears to be the appendix is unremarkable. Vascular/Lymphatic: Minimal aortic atherosclerosis. Patent branch vessels without occlusion or dissection. No lymphadenopathy. Reproductive: Mild  prostatomegaly. Central zone calcifications are noted. Seminal vesicles are nonacute. Other: A moderate amount of free intra-abdominal and pelvic fluid is identified raising concern for peritonitis given the free intraperitoneal air identified predominantly within the upper abdomen. Suspect upper gastrointestinal source for the bowel perforation possibly the stomach given history of epigastric pain and scattered foci of free air more notably about the stomach. Musculoskeletal: Osteoarthritis of the right SI joint. Mild degenerative change along the dorsal spine. No acute nor aggressive osseous abnormality. IMPRESSION: 1. Free intraperitoneal air compatible with perforated hollow viscus. Given the preponderance of free air in the upper abdomen, suspected an upper gastrointestinal source especially in light of epigastric pain on reported clinical exam per discussion with Dr. Sharma Covert. Moderate amount of free intraperitoneal fluid raising concern for peritonitis. These results were called by telephone at the time of interpretation on 03/03/2018 at 6:45 pm to Dr. Rockne Menghini , who verbally acknowledged these results. 2. Densely calcified atrophic pancreas compatible with stigmata of chronic pancreatitis. 3. Cholecystectomy. Electronically Signed   By: Tollie Eth M.D.   On: 03/03/2018 18:52   Dg Chest Portable 1 View  Result Date: 03/03/2018 CLINICAL DATA:  Epigastric abdominal pain EXAM: PORTABLE CHEST 1 VIEW COMPARISON:  09/24/2014 chest radiograph. FINDINGS: Stable cardiomediastinal silhouette with normal heart size. No pneumothorax. No pleural effusion. Low lung volumes with mild hazy bibasilar lung opacities, favor mild atelectasis. No pulmonary edema. Surgical clips are seen in the right upper quadrant of the abdomen. IMPRESSION: Low lung volumes with mild hazy bibasilar lung opacities, favor atelectasis. Electronically Signed   By: Delbert Phenix M.D.   On: 03/03/2018 17:36     ____________________________________________   PROCEDURES  Procedure(s) performed: None  Procedures  Critical Care performed: Yes ____________________________________________   INITIAL IMPRESSION / ASSESSMENT AND PLAN / ED COURSE  Pertinent labs & imaging results that were available during my care of the patient were reviewed by me and considered in my medical decision making (see chart for details).  57 y.o. male w/ daily alcohol use, GERD and pancreatitis history presenting with severe abdominal pain.  Overall, the patient is mildly hypotensive and we will treat him with aggressive fluid resuscitation and close monitoring.  His abdominal examination is concerning for recurrent pancreatitis.  Other etiologies include peptic or gastric ulcer disease, including perforation so an upright chest has been ordered.  Given that he does have some tenderness to palpation in the left lower quadrant, diverticulitis is also on the differential.  A CT of the abdomen has been ordered as well.  The patient will receive symptomatic treatment, intravenous fluids,  and laboratory studies will be analyzed.  Plan reevaluation for final disposition.  ----------------------------------------- 6:44 PM on 03/03/2018 -----------------------------------------  I just been called by the radiologist, and there is perforated viscus in the patient's abdomen including some increased free fluid.  The exact area is unclear, but the stomach is suspected.    Clinically, the patient has improved and his blood pressure has increased with intravenous fluid resuscitation.  Zosyn has been ordered.  I have spoken with Dr. Everlene Farrier, the surgeon on-call, who will evaluate the patient and his imaging for admission.  ----------------------------------------- 7:15 PM on 03/03/2018 -----------------------------------------  The patient has improving blood pressure and continues to have normal mentation.  A code sepsis has been  called.  The patient is receiving antibiotics and additional intravenous fluids at this time.  Patient will go to the operating room shortly.  CRITICAL CARE Performed by: Rockne Menghini   Total critical care time: 45 minutes  Critical care time was exclusive of separately billable procedures and treating other patients.  Critical care was necessary to treat or prevent imminent or life-threatening deterioration.  Critical care was time spent personally by me on the following activities: development of treatment plan with patient and/or surrogate as well as nursing, discussions with consultants, evaluation of patient's response to treatment, examination of patient, obtaining history from patient or surrogate, ordering and performing treatments and interventions, ordering and review of laboratory studies, ordering and review of radiographic studies, pulse oximetry and re-evaluation of patient's condition.  ____________________________________________  FINAL CLINICAL IMPRESSION(S) / ED DIAGNOSES  Final diagnoses:  Hypotension, unspecified hypotension type  Hypokalemia  Perforation bowel (HCC)  Sepsis, due to unspecified organism, unspecified whether acute organ dysfunction present [facility])         NEW MEDICATIONS STARTED DURING THIS VISIT:  New Prescriptions   No medications on file      Rockne Menghini, MD 03/03/18 1851    Rockne Menghini, MD 03/03/18 1916

## 2018-03-04 ENCOUNTER — Encounter: Payer: Self-pay | Admitting: Surgery

## 2018-03-04 ENCOUNTER — Other Ambulatory Visit: Payer: Self-pay

## 2018-03-04 LAB — COMPREHENSIVE METABOLIC PANEL
ALK PHOS: 106 U/L (ref 38–126)
ALT: 25 U/L (ref 0–44)
ANION GAP: 9 (ref 5–15)
AST: 33 U/L (ref 15–41)
Albumin: 2.9 g/dL — ABNORMAL LOW (ref 3.5–5.0)
BILIRUBIN TOTAL: 1.2 mg/dL (ref 0.3–1.2)
BUN: 7 mg/dL (ref 6–20)
CO2: 23 mmol/L (ref 22–32)
CREATININE: 0.76 mg/dL (ref 0.61–1.24)
Calcium: 7.4 mg/dL — ABNORMAL LOW (ref 8.9–10.3)
Chloride: 105 mmol/L (ref 98–111)
GFR calc non Af Amer: >60 mL/min (ref >60)
Glucose, Bld: 169 mg/dL — ABNORMAL HIGH (ref 70–99)
Potassium: 3.9 mmol/L (ref 3.5–5.1)
SODIUM: 137 mmol/L (ref 135–145)
Total Protein: 6.3 g/dL — ABNORMAL LOW (ref 6.5–8.1)

## 2018-03-04 LAB — CBC
HEMATOCRIT: 34 % — AB (ref 40.0–52.0)
HEMOGLOBIN: 11.4 g/dL — AB (ref 13.0–18.0)
MCH: 29.2 pg (ref 26.0–34.0)
MCHC: 33.5 g/dL (ref 32.0–36.0)
MCV: 87 fL (ref 80.0–100.0)
Platelets: 276 10*3/uL (ref 150–440)
RBC: 3.9 MIL/uL — AB (ref 4.40–5.90)
RDW: 19.5 % — ABNORMAL HIGH (ref 11.5–14.5)
WBC: 10.6 10*3/uL (ref 3.8–10.6)

## 2018-03-04 LAB — MAGNESIUM: Magnesium: 1.6 mg/dL — ABNORMAL LOW (ref 1.7–2.4)

## 2018-03-04 LAB — LACTIC ACID, PLASMA
Lactic Acid, Venous: 1.5 mmol/L (ref 0.5–1.9)
Lactic Acid, Venous: 1.2 mmol/L (ref 0.5–1.9)

## 2018-03-04 LAB — PHOSPHORUS: PHOSPHORUS: 3.9 mg/dL (ref 2.5–4.6)

## 2018-03-04 MED ORDER — HYDROMORPHONE HCL 1 MG/ML IJ SOLN
1.0000 mg | INTRAMUSCULAR | Status: DC | PRN
  Administered 2018-03-04 – 2018-03-07 (×14): 1 mg via INTRAVENOUS
  Filled 2018-03-04 (×14): qty 1

## 2018-03-04 MED ORDER — HEPARIN SODIUM (PORCINE) 5000 UNIT/ML IJ SOLN
5000.0000 [IU] | Freq: Three times a day (TID) | INTRAMUSCULAR | Status: DC
  Administered 2018-03-04 – 2018-03-07 (×10): 5000 [IU] via SUBCUTANEOUS
  Filled 2018-03-04 (×10): qty 1

## 2018-03-04 MED ORDER — LORAZEPAM 2 MG/ML IJ SOLN: 1.0000 mg | Freq: Four times a day (QID) | INTRAMUSCULAR | Status: AC | PRN

## 2018-03-04 MED ORDER — PROCHLORPERAZINE MALEATE 10 MG PO TABS
10.0000 mg | ORAL_TABLET | Freq: Four times a day (QID) | ORAL | Status: DC | PRN
  Filled 2018-03-04: qty 1

## 2018-03-04 MED ORDER — ONDANSETRON 4 MG PO TBDP: 4.0000 mg | ORAL_TABLET | Freq: Four times a day (QID) | ORAL | Status: DC | PRN

## 2018-03-04 MED ORDER — DEXTROSE IN LACTATED RINGERS 5 % IV SOLN
INTRAVENOUS | Status: DC
  Administered 2018-03-04 – 2018-03-07 (×5): via INTRAVENOUS

## 2018-03-04 MED ORDER — PROCHLORPERAZINE EDISYLATE 10 MG/2ML IJ SOLN
5.0000 mg | Freq: Four times a day (QID) | INTRAMUSCULAR | Status: DC | PRN
  Filled 2018-03-04: qty 2

## 2018-03-04 MED ORDER — ONDANSETRON HCL 4 MG/2ML IJ SOLN
4.0000 mg | Freq: Four times a day (QID) | INTRAMUSCULAR | Status: DC | PRN
  Administered 2018-03-06: 4 mg via INTRAVENOUS
  Filled 2018-03-04: qty 2

## 2018-03-04 MED ORDER — THIAMINE HCL 100 MG/ML IJ SOLN
Freq: Once | INTRAVENOUS | Status: AC
  Administered 2018-03-04: 15:00:00 via INTRAVENOUS
  Filled 2018-03-04: qty 1000

## 2018-03-04 MED ORDER — INFLUENZA VAC SPLIT QUAD 0.5 ML IM SUSY
0.5000 mL | PREFILLED_SYRINGE | INTRAMUSCULAR | Status: AC
  Administered 2018-03-05: 0.5 mL via INTRAMUSCULAR
  Filled 2018-03-04: qty 0.5

## 2018-03-04 MED ORDER — PANTOPRAZOLE SODIUM 40 MG IV SOLR: 40.0000 mg | Freq: Two times a day (BID) | INTRAVENOUS | Status: DC

## 2018-03-04 MED ORDER — OXYCODONE HCL 5 MG PO TABS
5.0000 mg | ORAL_TABLET | ORAL | Status: DC | PRN
  Administered 2018-03-05: 5 mg via ORAL
  Administered 2018-03-05 – 2018-03-07 (×5): 10 mg via ORAL
  Filled 2018-03-04 (×5): qty 2
  Filled 2018-03-04: qty 1

## 2018-03-04 MED ORDER — LORAZEPAM 2 MG/ML IJ SOLN: 0.0000 mg | Freq: Four times a day (QID) | INTRAMUSCULAR | Status: AC

## 2018-03-04 MED ORDER — SODIUM CHLORIDE 0.9 % IV SOLN
8.0000 mg/h | INTRAVENOUS | Status: DC
  Administered 2018-03-04 – 2018-03-06 (×6): 8 mg/h via INTRAVENOUS
  Filled 2018-03-04 (×6): qty 80

## 2018-03-04 MED ORDER — PIPERACILLIN-TAZOBACTAM 3.375 G IVPB
3.3750 g | Freq: Three times a day (TID) | INTRAVENOUS | Status: DC
  Administered 2018-03-04 – 2018-03-07 (×10): 3.375 g via INTRAVENOUS
  Filled 2018-03-04 (×10): qty 50

## 2018-03-04 MED ORDER — THIAMINE HCL 100 MG/ML IJ SOLN
100.0000 mg | Freq: Every day | INTRAMUSCULAR | Status: DC
  Administered 2018-03-04 – 2018-03-05 (×2): 100 mg via INTRAVENOUS
  Filled 2018-03-04 (×2): qty 2

## 2018-03-04 MED ORDER — LORAZEPAM 1 MG PO TABS: 1.0000 mg | ORAL_TABLET | Freq: Four times a day (QID) | ORAL | Status: AC | PRN

## 2018-03-04 MED ORDER — HYDRALAZINE HCL 20 MG/ML IJ SOLN
10.0000 mg | INTRAMUSCULAR | Status: DC | PRN
  Administered 2018-03-05 – 2018-03-06 (×2): 10 mg via INTRAVENOUS
  Filled 2018-03-04 (×2): qty 1

## 2018-03-04 MED ORDER — LORAZEPAM 2 MG/ML IJ SOLN: 0.0000 mg | Freq: Two times a day (BID) | INTRAMUSCULAR | Status: DC

## 2018-03-04 MED ORDER — VITAMIN B-1 100 MG PO TABS
100.0000 mg | ORAL_TABLET | Freq: Every day | ORAL | Status: DC
  Administered 2018-03-06 – 2018-03-07 (×2): 100 mg via ORAL
  Filled 2018-03-04 (×2): qty 1

## 2018-03-04 MED ORDER — SODIUM CHLORIDE 0.9 % IV SOLN
80.0000 mg | Freq: Once | INTRAVENOUS | Status: AC
  Administered 2018-03-04: 80 mg via INTRAVENOUS
  Filled 2018-03-04: qty 80

## 2018-03-04 NOTE — Progress Notes (Signed)
POD # 1 perf ulcer Doing very well Avss, good UO  PE NAD Abd: dressing intact. JP serous, No infection  A/P Doing well Dc foley Ambulate Gi series in am before removing NGT, if no leak may dc ngt and start clears DT protocol

## 2018-03-04 NOTE — Progress Notes (Signed)
Foley removed from patient. Tolerated well, patient educated to call for assistance when needs to use the bathroom. Will continue to assess and monitor.

## 2018-03-05 ENCOUNTER — Inpatient Hospital Stay: Payer: Medicaid Other

## 2018-03-05 LAB — CBC
HCT: 32.6 % — ABNORMAL LOW (ref 40.0–52.0)
HEMOGLOBIN: 10.4 g/dL — AB (ref 13.0–18.0)
MCH: 28.1 pg (ref 26.0–34.0)
MCHC: 32 g/dL (ref 32.0–36.0)
MCV: 87.9 fL (ref 80.0–100.0)
Platelets: 303 10*3/uL (ref 150–440)
RBC: 3.71 MIL/uL — AB (ref 4.40–5.90)
RDW: 19.9 % — ABNORMAL HIGH (ref 11.5–14.5)
WBC: 13.2 10*3/uL — ABNORMAL HIGH (ref 3.8–10.6)

## 2018-03-05 LAB — MAGNESIUM: Magnesium: 1.8 mg/dL (ref 1.7–2.4)

## 2018-03-05 LAB — PHOSPHORUS: Phosphorus: 2.4 mg/dL — ABNORMAL LOW (ref 2.5–4.6)

## 2018-03-05 LAB — URINE CULTURE: Culture: <10000 — AB

## 2018-03-05 MED ORDER — IOPAMIDOL (ISOVUE-300) INJECTION 61%
50.0000 mL | Freq: Once | INTRAVENOUS | Status: AC | PRN
  Administered 2018-03-05 (×4): 50 mL via ORAL
  Filled 2018-03-05 (×4): qty 50

## 2018-03-05 NOTE — Progress Notes (Signed)
IS education complete, pt understands technique and reason for use. Pt inspire 750ml+. Pt independent with use 

## 2018-03-05 NOTE — Progress Notes (Signed)
[facility] Progress Note  2 Days Post-Op  Subjective: He feels improved this morning. Some pain near his incision. No nausea or emesis. Afebrile. Has been NPO. NGT with 350 out in last 24 hours. No complaints of hallucinations, palpitations, tremors.   Objective: Vital signs in last 24 hours: Temp:  [98.1 F (36.7 C)-98.4 F (36.9 C)] 98.4 F (36.9 C) (10/07 0458) Pulse Rate:  [86-91] 87 (10/07 0458) Resp:  [18-22] 22 (10/07 0458) BP: (139-164)/(88-107) 164/103 (10/07 0458) SpO2:  [98 %-100 %] 98 % (10/07 0458) Last BM Date: 03/03/18  Intake/Output from previous day: 10/06 0701 - 10/07 0700 In: 2394.1 [I.V.:2222.1; NG/GT:100; IV Piggyback:52] Out: 980 [Urine:600; Emesis/NG output:350; Drains:30] Intake/Output this shift: Total I/O In: 583.6 [I.V.:333.6; NG/GT:200; IV Piggyback:50] Out: 800 [Urine:800]  PE: Gen:  Alert, NAD, pleasant HEENT: NGT in place, poor dentition Card:  Regular rate and rhythm Pulm:  Normal effort, clear to auscultation bilaterally Abd: Soft, incisional tenderness, non-distended, midline laparotomy incision C/D/I, dressing in place, JP in epigastric/RUQ with serosanguinous fluid in bulb.  Skin: warm and dry, no rashes  Psych: A&Ox3   Lab Results:  Recent Labs    03/04/18 0046 03/05/18 0447  WBC 10.6 13.2*  HGB 11.4* 10.4*  HCT 34.0* 32.6*  PLT 276 303   BMET Recent Labs    03/03/18 1718 03/04/18 0046  NA 138 137  K 2.9* 3.9  CL 106 105  CO2 23 23  GLUCOSE 104* 169*  BUN 6 7  CREATININE 1.03 0.76  CALCIUM 8.1* 7.4*   PT/INR No results for input(s): LABPROT, INR in the last 72 hours. CMP     Component Value Date/Time   NA 137 03/04/2018 0046   NA 143 10/27/2016 1840   NA 139 05/21/2013 0540   K 3.9 03/04/2018 0046   K 3.5 05/21/2013 0540   CL 105 03/04/2018 0046   CL 106 05/21/2013 0540   CO2 23 03/04/2018 0046   CO2 28 05/21/2013 0540   GLUCOSE 169 (H) 03/04/2018 0046   GLUCOSE 125 (H) 05/21/2013 0540    BUN 7 03/04/2018 0046   BUN 10 09/29/2016 2037   BUN 6 (L) 05/21/2013 0540   CREATININE 0.76 03/04/2018 0046   CREATININE 0.90 05/21/2013 0540   CALCIUM 7.4 (L) 03/04/2018 0046   CALCIUM 8.3 (L) 05/21/2013 0540   PROT 6.3 (L) 03/04/2018 0046   PROT 6.8 10/27/2016 1840   PROT 5.8 (L) 05/22/2013 0513   ALBUMIN 2.9 (L) 03/04/2018 0046   ALBUMIN 3.6 10/27/2016 1840   ALBUMIN 2.3 (L) 05/22/2013 0513   AST 33 03/04/2018 0046   AST 27 05/22/2013 0513   ALT 25 03/04/2018 0046   ALT 72 05/22/2013 0513   ALKPHOS 106 03/04/2018 0046   ALKPHOS 619 (H) 05/22/2013 0513   BILITOT 1.2 03/04/2018 0046   BILITOT 0.4 10/27/2016 1840   BILITOT 0.8 05/22/2013 0513   GFRNONAA >60 03/04/2018 0046   GFRNONAA >60 05/21/2013 0540   GFRAA >60 03/04/2018 0046   GFRAA >60 05/21/2013 0540   Lipase     Component Value Date/Time   LIPASE 16 03/03/2018 1718   LIPASE 200 05/18/2013 0603       Studies/Results: Ct Abdomen Pelvis W Contrast  Result Date: 03/03/2018 CLINICAL DATA:  57 year old male presents with severe abdominal pain starting today more focal in the epigastric region upon clinical exam per discussion with Dr. Sharma Covert. EXAM: CT ABDOMEN AND PELVIS WITH CONTRAST TECHNIQUE: Multidetector CT imaging of the abdomen and pelvis  was performed using the standard protocol following bolus administration of intravenous contrast. CONTRAST:  ISOVUE-300 IOPAMIDOL (ISOVUE-300) INJECTION 61% COMPARISON:  Same day CXR FINDINGS: Lower chest: Top-normal sized heart without pericardial effusion. Subsegmental atelectasis at each lung base. No effusion or pneumothorax. Hepatobiliary: Cholecystectomy. No enhancing hepatic mass. Periportal fluid is noted likely an extension the free fluid seen within the abdomen pelvis. Pancreas: Atrophic and densely calcified compatible with chronic pancreatitis. Spleen: No splenomegaly, mass or laceration. Adrenals/Urinary Tract: Normal bilateral adrenal glands. Symmetric  cortical enhancement of both kidneys. No obstructive uropathy. The urinary bladder is unremarkable. Stomach/Bowel: Small hiatal hernia. Moderate fluid-filled distention of the stomach. Normal small bowel rotation without mechanical bowel obstruction. Moderate stool retention within the right colon. No definite annular constricting lesion. Probable mild sympathetic thickening of the descending colon through rectum. What appears to be the appendix is unremarkable. Vascular/Lymphatic: Minimal aortic atherosclerosis. Patent branch vessels without occlusion or dissection. No lymphadenopathy. Reproductive: Mild prostatomegaly. Central zone calcifications are noted. Seminal vesicles are nonacute. Other: A moderate amount of free intra-abdominal and pelvic fluid is identified raising concern for peritonitis given the free intraperitoneal air identified predominantly within the upper abdomen. Suspect upper gastrointestinal source for the bowel perforation possibly the stomach given history of epigastric pain and scattered foci of free air more notably about the stomach. Musculoskeletal: Osteoarthritis of the right SI joint. Mild degenerative change along the dorsal spine. No acute nor aggressive osseous abnormality. IMPRESSION: 1. Free intraperitoneal air compatible with perforated hollow viscus. Given the preponderance of free air in the upper abdomen, suspected an upper gastrointestinal source especially in light of epigastric pain on reported clinical exam per discussion with Dr. Sharma Covert. Moderate amount of free intraperitoneal fluid raising concern for peritonitis. These results were called by telephone at the time of interpretation on 03/03/2018 at 6:45 pm to Dr. Rockne Menghini , who verbally acknowledged these results. 2. Densely calcified atrophic pancreas compatible with stigmata of chronic pancreatitis. 3. Cholecystectomy. Electronically Signed   By: Tollie Eth M.D.   On: 03/03/2018 18:52   Dg Chest Portable  1 View  Result Date: 03/03/2018 CLINICAL DATA:  Epigastric abdominal pain EXAM: PORTABLE CHEST 1 VIEW COMPARISON:  09/24/2014 chest radiograph. FINDINGS: Stable cardiomediastinal silhouette with normal heart size. No pneumothorax. No pleural effusion. Low lung volumes with mild hazy bibasilar lung opacities, favor mild atelectasis. No pulmonary edema. Surgical clips are seen in the right upper quadrant of the abdomen. IMPRESSION: Low lung volumes with mild hazy bibasilar lung opacities, favor atelectasis. Electronically Signed   By: Delbert Phenix M.D.   On: 03/03/2018 17:36   Dg Kayleen Memos W/water Sol Cm  Result Date: 03/05/2018 CLINICAL DATA:  Perforated ulcer repair. EXAM: WATER SOLUBLE UPPER GI SERIES TECHNIQUE: Single-column upper GI series was performed using water soluble contrast. CONTRAST:  120 mL Isovue-300 contrast was hand injected through an existing nasogastric tube. COMPARISON:  None. FLUOROSCOPY TIME:  Fluoroscopy Time: 1.5 minutes Radiation Exposure Index (if provided by the fluoroscopic device): 16.7mG y Number of Acquired Spot Images: 0 FINDINGS: Examination of the stomach demonstrated grossly normal rugal folds and areae gastricae given that the stomach is under distended. The gastric mucosa appeared unremarkable without evidence of ulceration, scarring, or mass lesion. Gastric motility and emptying was normal. Fluoroscopic examination of the duodenum demonstrates normal motility and morphology without evidence of ulceration or mass lesion. No extraluminal contrast to suggest perforation. IMPRESSION: No extraluminal contrast to suggest active gastric or duodenal perforation. Electronically Signed   By: Elige Ko  On: 03/05/2018 08:46    Anti-infectives: Anti-infectives (From admission, onward)   Start     Dose/Rate Route Frequency Ordered Stop   03/04/18 0015  piperacillin-tazobactam (ZOSYN) IVPB 3.375 g     3.375 g 12.5 mL/hr over 240 Minutes Intravenous Every 8 hours 03/04/18 0008      03/03/18 1845  piperacillin-tazobactam (ZOSYN) IVPB 3.375 g     3.375 g 100 mL/hr over 30 Minutes Intravenous  Once 03/03/18 1844 03/03/18 1919       Assessment/Plan  Perforated Gastric Ulcer  - POD2, showing clinical improvement this morning. Upper GI series reviewed with Dr. Everlene Farrier and this does not show evidence of leak.  - NGT removed this morning. - JP with 30 ccs of serosanguinous fluid in last 24 hours per chart review. Will leave in for now.  - Will trial on clear liquids today.  - Continue IVF, IV ABx, PPI - CIWA protocol given alcohol history - Mobilize - DVT Prophylaxis   LOS: 2 days    Lynden Oxford , PA-C [facility] 03/05/2018, 12:06 PM [phone] M-F: 7am - 4pm

## 2018-03-05 NOTE — Progress Notes (Signed)
NG remove by PA student.

## 2018-03-06 LAB — HIV ANTIBODY (ROUTINE TESTING W REFLEX): HIV SCREEN 4TH GENERATION: NONREACTIVE

## 2018-03-06 MED ORDER — SODIUM CHLORIDE 0.9 % IV SOLN
INTRAVENOUS | Status: DC | PRN
  Administered 2018-03-06: 500 mL via INTRAVENOUS

## 2018-03-06 MED ORDER — PANTOPRAZOLE SODIUM 40 MG PO TBEC
40.0000 mg | DELAYED_RELEASE_TABLET | Freq: Two times a day (BID) | ORAL | Status: DC
  Administered 2018-03-06 – 2018-03-07 (×3): 40 mg via ORAL
  Filled 2018-03-06 (×3): qty 1

## 2018-03-06 NOTE — Progress Notes (Addendum)
SURGICAL PROGRESS NOTE  Patient seen and examined as described below with surgical PA-C, Gillermina Phy.  Assessment/Plan: (ICD-10's: K25.1) 57 y.o. male with post-surgical ileus 3 Days s/p laparotomy with Cheree Ditto patch for perforated gastric ulcer, complicated by comorbidities including HTN, polysubstance abuse including alcohol abuse (40 oz beers x 2-3/day), history of alcoholic pancreatitis, chronic ongoing tobacco abuse (smoking), and history of major depression disorder.   - CIWA protocol ongoing  - PPI and antibiotics ongoing  - gradually advance diet as tolerated  - monitor abdominal exam and bowel function  - decrease IV fluids with advancement of diet  - follow-up/trend WBC and electrolytes tomorrow morning  - DVT prophylaxis, ambulation encouraged  All of the above findings and recommendations were discussed with the patient, and all of patient's questions were answered to his expressed satisfaction.  -- Scherrie Gerlach Earlene Plater, MD, RPVI [place]: [facility] General Surgery - Partnering for exceptional care. Office: [phone]      SURGICAL PROGRESS NOTE  Hospital Day(s): 3.   Post op day(s): 3 Days Post-Op.   Interval History: Patient seen and examined, no acute events or new complaints overnight. Patient reports abdominal soreness over his incision. He was able to tolerate clear liquids without nausea or emesis, requests advancement of his diet this morning. He reports he ambulated yesterday, denies flatus or BM.   Review of Systems:  Constitutional: denies fever, chills  Respiratory: denies any shortness of breath  Cardiovascular: denies chest pain or palpitations  Gastrointestinal: + abdominal pain, Denied N/V, or diarrhea/and bowel function as per interval history Musculoskeletal: denies pain, decreased motor or sensation Integumentary: denies any other rashes or skin discolorations  Vital signs in last 24 hours: [min-max] current  Temp:  [98.1 F  (36.7 C)-98.6 F (37 C)] 98.6 F (37 C) (10/08 0451) Pulse Rate:  [81-97] 81 (10/08 0451) Resp:  [18-20] 18 (10/08 0439) BP: (150-183)/(86-114) 158/86 (10/08 0451) SpO2:  [94 %-100 %] 100 % (10/08 0444)     Height: 5\' 11"  (180.3 cm) Weight: 59 kg BMI (Calculated): 18.14   Intake/Output this shift:  Total I/O In: -  Out: 620 [Urine:620]   Intake/Output last 2 shifts:  @IOLAST2SHIFTS @   Physical Exam:  Constitutional: alert, cooperative and no distress  Respiratory: breathing non-labored at rest  Cardiovascular: regular rate and sinus rhythm  Gastrointestinal: soft and non-distended with mild peri-incisional tenderness to palpation, midline laparotomy incision appears well-approximated without surrounding erythema or drainage, honeycomb dressing c/d/i, RUQ JP with minimal serosanguinous fluid in bulb suction and tubing.   Labs:  CBC Latest Ref Rng & Units 03/05/2018 03/04/2018 03/03/2018  WBC 3.8 - 10.6 K/uL 13.2(H) 10.6 12.6(H)  Hemoglobin 13.0 - 18.0 g/dL 10.4(L) 11.4(L) 11.7(L)  Hematocrit 40.0 - 52.0 % 32.6(L) 34.0(L) 36.2(L)  Platelets 150 - 440 K/uL 303 276 424   CMP Latest Ref Rng & Units 03/04/2018 03/03/2018 01/25/2017  Glucose 70 - 99 mg/dL 098(J) 191(Y) 782(N)  BUN 6 - 20 mg/dL 7 6 12   Creatinine 0.61 - 1.24 mg/dL 5.62 1.30 8.65  Sodium 135 - 145 mmol/L 137 138 140  Potassium 3.5 - 5.1 mmol/L 3.9 2.9(L) 3.9  Chloride 98 - 111 mmol/L 105 106 106  CO2 22 - 32 mmol/L 23 23 27   Calcium 8.9 - 10.3 mg/dL 7.4(L) 8.1(L) 8.6(L)  Total Protein 6.5 - 8.1 g/dL 6.3(L) 6.1(L) 7.5  Total Bilirubin 0.3 - 1.2 mg/dL 1.2 0.4 0.6  Alkaline Phos 38 - 126 U/L 106 124 137(H)  AST 15 -  41 U/L 33 34 126(H)  ALT 0 - 44 U/L 25 25 47    Imaging studies: No new pertinent imaging studies   Assessment/Plan: (ICD-10's: K25.1) 57 y.o. male with a perforated ulcer 3 Days Post-Op s/p laparotomy with Cheree Ditto patch for perforated gastric ulcer, complicated by pertinent comorbidities including HTN,  polysubstance abuse including heavy alcohol abuse (2-3 40 oz beers/day), history of alcoholic pancreatitis, chronic ongoing tobacco abuse (smoking), and history of major depression disorder.   - CIWA protocol ongoing  - PPI and antibiotics ongoing  - gradually advance diet as tolerated  - monitor abdominal exam and bowel function  - decrease IV fluids with advancement of diet  - follow-up/trend WBC and electrolytes tomorrow morning  - DVT prophylaxis, ambulation encouraged  All of the above findings and recommendations were discussed with the patient, and the medical team, and all of patient's questions were answered to his expressed satisfaction.  -- Lynden Oxford, PA-C [facility] 03/06/2018, 8:15 AM [phone] M-F: 7am - 4pm

## 2018-03-07 LAB — CBC
HCT: 32.6 % — ABNORMAL LOW (ref 39.0–52.0)
Hemoglobin: 10.8 g/dL — ABNORMAL LOW (ref 13.0–17.0)
MCH: 27.5 pg (ref 26.0–34.0)
MCHC: 33.1 g/dL (ref 30.0–36.0)
MCV: 83 fL (ref 80.0–100.0)
NRBC: 0 % (ref 0.0–0.2)
PLATELETS: 358 10*3/uL (ref 150–400)
RBC: 3.93 MIL/uL — AB (ref 4.22–5.81)
RDW: 17.8 % — ABNORMAL HIGH (ref 11.5–15.5)
WBC: 9.9 10*3/uL (ref 4.0–10.5)

## 2018-03-07 LAB — PHOSPHORUS: PHOSPHORUS: 2.3 mg/dL — AB (ref 2.5–4.6)

## 2018-03-07 MED ORDER — OMEPRAZOLE 40 MG PO CPDR: 40.0000 mg | DELAYED_RELEASE_CAPSULE | Freq: Two times a day (BID) | ORAL | 0 refills | Status: DC

## 2018-03-07 MED ORDER — HYDROCODONE-ACETAMINOPHEN 5-325 MG PO TABS: 1.0000 | ORAL_TABLET | Freq: Four times a day (QID) | ORAL | 0 refills | Status: DC | PRN

## 2018-03-07 MED ORDER — AMOXICILLIN 500 MG PO TABS: 500.0000 mg | ORAL_TABLET | Freq: Two times a day (BID) | ORAL | 0 refills | Status: DC

## 2018-03-07 MED ORDER — CLARITHROMYCIN ER 500 MG PO TB24: 1000.0000 mg | ORAL_TABLET | Freq: Every day | ORAL | 0 refills | Status: DC

## 2018-03-07 NOTE — Progress Notes (Signed)
[facility] Progress Note  4 Days Post-Op  Subjective: He is sitting up in bed this morning. He continues to note improvement in his abdominal pain near his incision. No nausea or emesis. Has been tolerating full liquids and would like to eat more. +BM  Objective: Vital signs in last 24 hours: Temp:  [98.1 F (36.7 C)-98.4 F (36.9 C)] 98.4 F (36.9 C) (10/09 0847) Pulse Rate:  [88-97] 89 (10/09 0847) Resp:  [20-24] 20 (10/09 0502) BP: (141-181)/(96-118) 141/118 (10/09 0847) SpO2:  [98 %-100 %] 99 % (10/09 0847) Last BM Date: 03/03/18(per pt)  Intake/Output from previous day: 10/08 0701 - 10/09 0700 In: 2533.2 [P.O.:180; I.V.:2244.9; IV Piggyback:108.3] Out: 2865 [Urine:2830; Drains:35] Intake/Output this shift: No intake/output data recorded.  PE: Gen:  Alert, NAD, pleasant Pulm:  Normal effort Abd: Soft, non-tender, non-distended, JP in RUQ with serosanguinous fluid in bulb, midline laparotomy is CDI, no surrounding erythema or drainage.  Skin: warm and dry, no rashes  Psych: A&Ox3   Lab Results:  Recent Labs    03/05/18 0447 03/07/18 0506  WBC 13.2* 9.9  HGB 10.4* 10.8*  HCT 32.6* 32.6*  PLT 303 358   BMET No results for input(s): NA, K, CL, CO2, GLUCOSE, BUN, CREATININE, CALCIUM in the last 72 hours. PT/INR No results for input(s): LABPROT, INR in the last 72 hours. CMP     Component Value Date/Time   NA 137 03/04/2018 0046   NA 143 10/27/2016 1840   NA 139 05/21/2013 0540   K 3.9 03/04/2018 0046   K 3.5 05/21/2013 0540   CL 105 03/04/2018 0046   CL 106 05/21/2013 0540   CO2 23 03/04/2018 0046   CO2 28 05/21/2013 0540   GLUCOSE 169 (H) 03/04/2018 0046   GLUCOSE 125 (H) 05/21/2013 0540   BUN 7 03/04/2018 0046   BUN 10 09/29/2016 2037   BUN 6 (L) 05/21/2013 0540   CREATININE 0.76 03/04/2018 0046   CREATININE 0.90 05/21/2013 0540   CALCIUM 7.4 (L) 03/04/2018 0046   CALCIUM 8.3 (L) 05/21/2013 0540   PROT 6.3 (L) 03/04/2018 0046   PROT 6.8 10/27/2016 1840   PROT 5.8 (L) 05/22/2013 0513   ALBUMIN 2.9 (L) 03/04/2018 0046   ALBUMIN 3.6 10/27/2016 1840   ALBUMIN 2.3 (L) 05/22/2013 0513   AST 33 03/04/2018 0046   AST 27 05/22/2013 0513   ALT 25 03/04/2018 0046   ALT 72 05/22/2013 0513   ALKPHOS 106 03/04/2018 0046   ALKPHOS 619 (H) 05/22/2013 0513   BILITOT 1.2 03/04/2018 0046   BILITOT 0.4 10/27/2016 1840   BILITOT 0.8 05/22/2013 0513   GFRNONAA >60 03/04/2018 0046   GFRNONAA >60 05/21/2013 0540   GFRAA >60 03/04/2018 0046   GFRAA >60 05/21/2013 0540   Lipase     Component Value Date/Time   LIPASE 16 03/03/2018 1718   LIPASE 200 05/18/2013 0603       Studies/Results: Dg Ugi W/water Sol Cm  Result Date: 03/05/2018 CLINICAL DATA:  Perforated ulcer repair. EXAM: WATER SOLUBLE UPPER GI SERIES TECHNIQUE: Single-column upper GI series was performed using water soluble contrast. CONTRAST:  120 mL Isovue-300 contrast was hand injected through an existing nasogastric tube. COMPARISON:  None. FLUOROSCOPY TIME:  Fluoroscopy Time: 1.5 minutes Radiation Exposure Index (if provided by the fluoroscopic device): 16.7mG y Number of Acquired Spot Images: 0 FINDINGS: Examination of the stomach demonstrated grossly normal rugal folds and areae gastricae given that the stomach is under distended. The gastric mucosa appeared unremarkable without  evidence of ulceration, scarring, or mass lesion. Gastric motility and emptying was normal. Fluoroscopic examination of the duodenum demonstrates normal motility and morphology without evidence of ulceration or mass lesion. No extraluminal contrast to suggest perforation. IMPRESSION: No extraluminal contrast to suggest active gastric or duodenal perforation. Electronically Signed   By: Elige Ko   On: 03/05/2018 08:46    Anti-infectives: Anti-infectives (From admission, onward)   Start     Dose/Rate Route Frequency Ordered Stop   03/04/18 0015  piperacillin-tazobactam (ZOSYN) IVPB 3.375  g     3.375 g 12.5 mL/hr over 240 Minutes Intravenous Every 8 hours 03/04/18 0008     03/03/18 1845  piperacillin-tazobactam (ZOSYN) IVPB 3.375 g     3.375 g 100 mL/hr over 30 Minutes Intravenous  Once 03/03/18 1844 03/03/18 1919       Assessment/Plan  Perforated Gastric Ulcer  - POD4, showing clinical improvement this morning, abdomen is soft and non-distended, and he is tolerating a diet. .  - JP with 35 ccs of serosanguinous fluid in last 24 hours per chart review. Likely remove at discharge  - Advance to full diet - Continue IV ABx - PO PPI - D/C IVF - CIWA protocol given alcohol history - Mobilize - DVT Prophylaxis   - Likely home this afternoon    LOS: 4 days    Lynden Oxford , PA-C [facility] 03/07/2018, 9:12 AM [phone] M-F: 7am - 4pm

## 2018-03-07 NOTE — Discharge Summary (Addendum)
Discharge Summary  Patient ID: Sean [name] MRN: [id number] DOB/AGE: 1961-04-27 57 y.o.  Admit date: 03/03/2018 Discharge date: 03/07/2018  Discharge Diagnoses Perforated Gastric Ulcer  Consultants None  Procedures Exploratory Laparotomy with Cheree Ditto Patch on 03/03/18  HPI: Sean [name] is a 57 y.o. male who presents to the ED on 10/05 with acute onsent of severe epigastric pain, pain is constant, sharp and worsening w movement. Some nausea but no emesis. He took some Sports administrator. And he takes "goodys" a few times a week and he drinks about 2-3 40 oz beers. CT revealed pneumoperitoneum with cncern for perforated gastric ulcer. He was hypotensive in the ED and responded to IVF resuscitation. Admitted to general surgery with plans for emergent laparotomy.   Hospital Course: On the day of presentation (10/05) he underwent emergent Exploratory Laparotomy with Cheree Ditto Patch with Dr. Everlene Farrier which revealed a perforated gastric ulcer. His immediate post-operative period was unremarkable. He underwent an upper GI series on 10/07 to evaluate the graham patch which did not reveal any leaks. His NGT was removed on that day and he was started on clear liquids. His diet was slowly advanced and on 10/09 he was tolerating a full diet. On the day of discharge (10/09), Sean [name] was tolerating a full diet, mobilizing well, and his pain was adequately controlled.   Discussed extensively the importance of remaining on a PPI, avoid NSAIDs, and refraining for cocaine and alcohol use to allow his stomach to heal. He voiced understand that he should avoid the above for the remainder of his life. In addition, he should avoid carbonation for at least 1 month post operatively.   He will be discharged with coverage for H. Pylori including omeprazole, amoxicillin, and clarithromycin for 14 days  He will follow up in 1 week.    Allergies as of 03/07/2018   No Known Allergies     Medication List    STOP taking  these medications   aspirin 81 MG chewable tablet   celecoxib 200 MG capsule Commonly known as:  CELEBREX   colchicine 0.6 MG tablet   esomeprazole 40 MG capsule Commonly known as:  NEXIUM     TAKE these medications   allopurinol 300 MG tablet Commonly known as:  ZYLOPRIM Take 1 tablet (300 mg total) by mouth Two (2) times a day.   ALPHAGAN P 0.1 % Soln Generic drug:  brimonidine Administer 1 drop into the left eye Two (2) times a day.   amoxicillin 500 MG tablet Commonly known as:  AMOXIL Take 1 tablet (500 mg total) by mouth 2 (two) times daily for 14 days.   atorvastatin 40 MG tablet Commonly known as:  LIPITOR Take 1 tablet (40 mg total) by mouth daily. For cholesterol   bisacodyl 5 MG EC tablet Generic drug:  bisacodyl TAKE ONE TABLET BY MOUTH EVERY DAY AS NEEDED FOR CONSTIPATION   clarithromycin 500 MG 24 hr tablet Commonly known as:  BIAXIN XL Take 2 tablets (1,000 mg total) by mouth daily for 14 days.   dorzolamide-timolol 22.3-6.8 MG/ML ophthalmic solution Commonly known as:  COSOPT Administer 1 drop to both eyes Two (2) times a day.   fluticasone 50 MCG/ACT nasal spray Commonly known as:  FLONASE Place 2 sprays into both nostrils daily.   HYDROcodone-acetaminophen 5-325 MG tablet Commonly known as:  NORCO/VICODIN Take 1 tablet by mouth every 6 (six) hours as needed for severe pain.   omeprazole 40 MG capsule Commonly known as:  PRILOSEC Take  1 capsule (40 mg total) by mouth 2 (two) times daily for 14 days.   polyethylene glycol powder powder Commonly known as:  GLYCOLAX/MIRALAX MIX 17 G WITH 8 OZ OF FLUID AND TAKE BY MOUTH ONCE DAILY AS NEEDED   prednisoLONE acetate 1 % ophthalmic suspension Commonly known as:  PRED FORTE INSTILL 1 DROP IN RIGHT EYE 4 TIMES A DAY   tamsulosin 0.4 MG Caps capsule Commonly known as:  FLOMAX Take 1 capsule (0.4 mg total) by mouth daily.   TRAVATAN Z 0.004 % Soln ophthalmic solution Generic drug:  Travoprost  (BAK Free) Administer 1 drop to both eyes every evening.   VOLTAREN 1 % Gel Generic drug:  diclofenac sodium APPLY 2 GRAMS TO JOINTS 3 TO 4 TIMES DAILY AS NEEDED FOR PAIN        Follow-up Information    Leafy Ro, MD. Go on 03/14/2018.   Specialty:  General Surgery Why:  @4pm   perforated gastric ulcer, s/p ex lap & graham patch 10/05 with dr. Leonides Grills information: [address] [place] [address] [phone]           Signed: Lynden Oxford , PA-C [facility]  03/07/2018, 2:09 PM [phone] M-F: 7am - 4pm

## 2018-03-07 NOTE — Discharge Instructions (Signed)
Avoid use of the following... 1. NSAIDs (Motrin, Naproxen)  2. Aspirin / goodie's Powder 3. Alcohol 4. Cocaine  Avoid Carbonation for 1 month  Take PPI (Omeprazole) and 2 antibiotics (Amoxicillin && Clarithromycin) for 14 DAYS as prescribed  Follow up with Dr. Everlene Farrier in 2 weeks

## 2018-03-07 NOTE — Progress Notes (Signed)
Patient cleared for discharge. IV's removed. JP drain removed. Belongings gathered. Instructions and prescriptions given to patient and brother. Discharged to home via POV.

## 2018-03-08 ENCOUNTER — Other Ambulatory Visit: Payer: Self-pay | Admitting: Physician Assistant

## 2018-03-08 ENCOUNTER — Telehealth: Payer: Self-pay | Admitting: Surgery

## 2018-03-08 MED ORDER — AMOXICILLIN 500 MG PO TABS: 500.0000 mg | ORAL_TABLET | Freq: Two times a day (BID) | ORAL | 0 refills | Status: AC

## 2018-03-08 MED ORDER — HYDROCODONE-ACETAMINOPHEN 5-325 MG PO TABS: 1.0000 | ORAL_TABLET | Freq: Four times a day (QID) | ORAL | 0 refills | Status: DC | PRN

## 2018-03-08 MED ORDER — CLARITHROMYCIN ER 500 MG PO TB24: 1000.0000 mg | ORAL_TABLET | Freq: Every day | ORAL | 0 refills | Status: AC

## 2018-03-08 MED ORDER — OMEPRAZOLE 40 MG PO CPDR: 40.0000 mg | DELAYED_RELEASE_CAPSULE | Freq: Two times a day (BID) | ORAL | 0 refills | Status: DC

## 2018-03-08 NOTE — Telephone Encounter (Signed)
Spoke with Lynden Oxford PA who prescribed medication yesterday. The patient's pharmacy was not in the system at the time and the prescription did not go through electronically. I have entered the patient's pharmacy and he will resend the prescriptions.

## 2018-03-08 NOTE — Progress Notes (Signed)
Called by LPN regarding patient stating he never received his printed prescription. LPN contacted pharmacy who enforced the patient did not have his prescriptions and LPN added pharmacy to patients chart  Action: resent patients prescriptions electronically  Lynden Oxford, PA-C [facility] 03/08/2018, 12:10 PM [phone] M-F: 7am - 4pm

## 2018-03-08 NOTE — Telephone Encounter (Signed)
Kenard Gower is calling from the pharmacy and is looking for some prescriptions on the patient he can be reached at [phone]. Please call and advise

## 2018-03-13 LAB — CULTURE, BLOOD (ROUTINE X 2)
Culture: NO GROWTH
Culture: NO GROWTH
SPECIAL REQUESTS: ADEQUATE
Special Requests: ADEQUATE

## 2018-03-14 ENCOUNTER — Encounter: Payer: Self-pay | Admitting: Surgery

## 2018-03-14 ENCOUNTER — Ambulatory Visit (INDEPENDENT_AMBULATORY_CARE_PROVIDER_SITE_OTHER): Payer: Medicaid Other | Admitting: Surgery

## 2018-03-14 VITALS — BP 158/98 | HR 90 | Temp 97.7°F | Resp 12 | Ht 68.0 in | Wt 119.0 lb

## 2018-03-14 DIAGNOSIS — K275 Chronic or unspecified peptic ulcer, site unspecified, with perforation: Secondary | ICD-10-CM

## 2018-03-14 NOTE — Patient Instructions (Addendum)
Return in 6 weeks . The patient is aware to call back for any questions or concerns. The sutures were removed and steri strips applied.  GENERAL POST-OPERATIVE PATIENT INSTRUCTIONS   WOUND CARE INSTRUCTIONS:  Keep a dry clean dressing on the wound if there is drainage. The initial bandage may be removed after 24 hours.  Once the wound has quit draining you may leave it open to air.  If clothing rubs against the wound or causes irritation and the wound is not draining you may cover it with a dry dressing during the daytime.  Try to keep the wound dry and avoid ointments on the wound unless directed to do so.  If the wound becomes bright red and painful or starts to drain infected material that is not clear, please contact your physician immediately.  If the wound is mildly pink and has a thick firm ridge underneath it, this is normal, and is referred to as a healing ridge.  This will resolve over the next 4-6 weeks.  BATHING: You may shower if you have been informed of this by your surgeon. However, Please do not submerge in a tub, hot tub, or pool until incisions are completely sealed or have been told by your surgeon that you may do so.  DIET:  You may eat any foods that you can tolerate.  It is a good idea to eat a high fiber diet and take in plenty of fluids to prevent constipation.  If you do become constipated you may want to take a mild laxative or take ducolax tablets on a daily basis until your bowel habits are regular.  Constipation can be very uncomfortable, along with straining, after recent surgery.  ACTIVITY:  You are encouraged to cough and deep breath or use your incentive spirometer if you were given one, every 15-30 minutes when awake.  This will help prevent respiratory complications and low grade fevers post-operatively if you had a general anesthetic.  You may want to hug a pillow when coughing and sneezing to add additional support to the surgical area, if you had abdominal or chest  surgery, which will decrease pain during these times.  You are encouraged to walk and engage in light activity for the next two weeks.  You should not lift more than 20 pounds, until 11/16//2019 as it could put you at increased risk for complications.  Twenty pounds is roughly equivalent to a plastic bag of groceries. At that time- Listen to your body when lifting, if you have pain when lifting, stop and then try again in a few days. Soreness after doing exercises or activities of daily living is normal as you get back in to your normal routine.  MEDICATIONS:  Try to take narcotic medications and anti-inflammatory medications, such as tylenol, ibuprofen, naprosyn, etc., with food.  This will minimize stomach upset from the medication.  Should you develop nausea and vomiting from the pain medication, or develop a rash, please discontinue the medication and contact your physician.  You should not drive, make important decisions, or operate machinery when taking narcotic pain medication.  SUNBLOCK Use sun block to incision area over the next year if this area will be exposed to sun. This helps decrease scarring and will allow you avoid a permanent darkened area over your incision.  QUESTIONS:  Please feel free to call our office if you have any questions, and we will be glad to assist you. [phone]

## 2018-03-15 ENCOUNTER — Encounter: Payer: Self-pay | Admitting: Surgery

## 2018-03-15 NOTE — Progress Notes (Signed)
S/p graham patch perf ulcer Doing very well No comaplints , taking PO, no fevers  Abd : soft, incision c/d/i, staples removed, no infection  A/p Doing well Continue PPI and H pylory rx GI consult for EGD 6 wks RTC 7 wks

## 2018-03-21 ENCOUNTER — Telehealth: Payer: Self-pay | Admitting: *Deleted

## 2018-03-21 NOTE — Telephone Encounter (Signed)
Patient had refused appointment with [facility] according to the referral. The patient stated he already had stomach surgery and didn't need the appointment.   Brother, Orvilla Fus Hannen, was given number to [facility] to call and schedule an appointment for patient's upper endoscopy.   Follow up with Dr. Everlene Farrier will be after upper endoscopy is complete.

## 2018-04-16 ENCOUNTER — Ambulatory Visit: Payer: Self-pay | Admitting: Family Medicine

## 2018-04-25 ENCOUNTER — Ambulatory Visit: Payer: Medicaid Other | Admitting: Surgery

## 2018-05-01 ENCOUNTER — Ambulatory Visit: Payer: Self-pay | Admitting: Gastroenterology

## 2018-05-07 ENCOUNTER — Ambulatory Visit: Payer: Self-pay | Admitting: Gastroenterology

## 2018-05-07 ENCOUNTER — Encounter: Payer: Self-pay | Admitting: Gastroenterology

## 2018-05-07 DIAGNOSIS — K275 Chronic or unspecified peptic ulcer, site unspecified, with perforation: Secondary | ICD-10-CM

## 2018-05-10 ENCOUNTER — Ambulatory Visit: Payer: Medicaid Other | Admitting: Family Medicine

## 2018-05-21 ENCOUNTER — Ambulatory Visit: Payer: Medicaid Other | Admitting: Surgery

## 2018-07-18 ENCOUNTER — Encounter: Payer: Self-pay | Admitting: Gastroenterology

## 2018-07-18 ENCOUNTER — Ambulatory Visit: Payer: Medicaid Other | Admitting: Gastroenterology

## 2018-07-18 ENCOUNTER — Other Ambulatory Visit: Payer: Self-pay

## 2018-07-18 VITALS — BP 169/97 | HR 87 | Ht 68.0 in | Wt 122.2 lb

## 2018-07-18 DIAGNOSIS — B9681 Helicobacter pylori [H. pylori] as the cause of diseases classified elsewhere: Secondary | ICD-10-CM | POA: Diagnosis not present

## 2018-07-18 DIAGNOSIS — K279 Peptic ulcer, site unspecified, unspecified as acute or chronic, without hemorrhage or perforation: Secondary | ICD-10-CM

## 2018-07-18 DIAGNOSIS — Z1211 Encounter for screening for malignant neoplasm of colon: Secondary | ICD-10-CM

## 2018-07-18 MED ORDER — OMEPRAZOLE 40 MG PO CPDR: 40.0000 mg | DELAYED_RELEASE_CAPSULE | Freq: Every day | ORAL | 0 refills | Status: AC

## 2018-07-18 NOTE — Progress Notes (Signed)
Wyline Mood MD, MRCP(U.K) [address]  [address]  [place], [place] [address]  Main: [phone]  Fax: [phone]   Gastroenterology Consultation  Referring Provider:     Leafy Ro, MD Primary Care Physician:  Patient, No Pcp Per Primary Gastroenterologist:  Dr. Wyline Mood  Reason for Consultation:     EGD        HPI:   Sean [name] is a 58 y.o. y/o male referred by Dr Everlene Farrier.    Admitted to the hospital in 10/2019with an acute abdomen , had been on NSAID's , drank better daily . Underwent Ex lap for a perforated gastric ulcer. Treated for H pylori with triple clarithromycin based therapy.   Here to see me today for an EGD . Since surgery doing well, no abdominal pain, no weight loss.  No NSAID's. Still drinking alcohol. Does not smoke  No gastric cancer in his family . Never had a colonoscopy. No family history of colon cancer.    Past Medical History:  Diagnosis Date  . Anxiety   . Arthritis   . Depression   . GERD (gastroesophageal reflux disease)   . Hypertension   . Pancreatitis   . Pneumonia   . Substance abuse (HCC)     Past Surgical History:  Procedure Laterality Date  . gallstone removal    . LAPAROTOMY N/A 03/03/2018   Procedure: EXPLORATORY LAPAROTOMY with  graham patch;  Surgeon: Leafy Ro, MD;  Location: [facility] ORS;  Service: General;  Laterality: N/A;    Prior to Admission medications   Medication Sig Start Date End Date Taking? Authorizing Provider  allopurinol (ZYLOPRIM) 300 MG tablet Take 1 tablet (300 mg total) by mouth Two (2) times a day. 11/03/16   [provider]  ALPHAGAN P 0.1 % SOLN Administer 1 drop into the left eye Two (2) times a day. 11/03/16   [provider]  atorvastatin (LIPITOR) 40 MG tablet Take 1 tablet (40 mg total) by mouth daily. For cholesterol 11/03/16   [provider]  BISACODYL 5 MG EC tablet TAKE ONE TABLET BY MOUTH EVERY DAY AS NEEDED FOR CONSTIPATION 11/17/16   [provider]  dorzolamide-timolol (COSOPT) 22.3-6.8 MG/ML ophthalmic solution Administer 1 drop to both eyes Two (2) times a day. 11/03/16   [provider]  fluticasone (FLONASE) 50 MCG/ACT nasal spray Place 2 sprays into both nostrils daily. 11/03/16   [provider]  HYDROcodone-acetaminophen (NORCO/VICODIN) 5-325 MG tablet Take 1 tablet by mouth every 6 (six) hours as needed for severe pain. 03/08/18   Donovan Kail, PA-C  omeprazole (PRILOSEC) 40 MG capsule Take 1 capsule (40 mg total) by mouth 2 (two) times daily for 14 days. 03/08/18 03/22/18  Donovan Kail, PA-C  polyethylene glycol powder (GLYCOLAX/MIRALAX) powder MIX 17 G WITH 8 OZ OF FLUID AND TAKE BY MOUTH ONCE DAILY AS NEEDED 11/03/16   [provider]  prednisoLONE acetate (PRED FORTE) 1 % ophthalmic suspension INSTILL 1 DROP IN RIGHT EYE 4 TIMES A DAY 01/20/17   [provider]  tamsulosin (FLOMAX) 0.4 MG CAPS capsule Take 1 capsule (0.4 mg total) by mouth daily. 11/03/16   [provider]  TRAVATAN Z 0.004 % SOLN ophthalmic solution Administer 1 drop to both eyes every evening. 11/03/16   [provider]  VOLTAREN 1 % GEL APPLY 2 GRAMS TO JOINTS 3 TO 4 TIMES DAILY AS NEEDED FOR PAIN 10/06/16   [provider]  Family History  Problem Relation Age of Onset  . Alcohol abuse Mother   . Arthritis Mother   . Hypertension Mother   . Varicose Veins Mother   . Alcohol abuse Father   . Cancer Father   . Early death Father   . Alcohol abuse Sister   . Cancer Sister   . COPD Sister   . Depression Sister   . Early death Sister   . Heart disease Sister   . Varicose Veins Sister   . Birth defects Brother   . Diabetes Brother   . Learning disabilities Brother   . Mental illness Brother   . Mental retardation Brother   . Vision loss Brother      Social History   Tobacco Use  . Smoking status: Current Every Day Smoker    Packs/day: 0.50    Years: 15.00    Pack years: 7.50     Types: Cigarettes  . Smokeless tobacco: Never Used  Substance Use Topics  . Alcohol use: Yes    Alcohol/week: 84.0 standard drinks    Types: 84 Cans of beer per week    Comment: 40 oz four times/day  . Drug use: Yes    Types: Cocaine, Marijuana    Comment: Infrequent.    Allergies as of 07/18/2018  . (No Known Allergies)    Review of Systems:    All systems reviewed and negative except where noted in HPI.   Physical Exam:  There were no vitals taken for this visit. No LMP for male patient. Psych:  Alert and cooperative. Normal mood and affect. General:   Alert,  Well-developed, well-nourished, pleasant and cooperative in NAD Head:  Normocephalic and atraumatic. Eyes:  Sclera clear, no icterus.   Conjunctiva pink. Ears:  Normal auditory acuity. Nose:  No deformity, discharge, or lesions. Mouth:  No deformity or lesions,oropharynx pink & moist. Neck:  Supple; no masses or thyromegaly. Lungs:  Respirations even and unlabored.  Clear throughout to auscultation.   No wheezes, crackles, or rhonchi. No acute distress. Heart:  Regular rate and rhythm; no murmurs, clicks, rubs, or gallops. Abdomen:  Normal bowel sounds.  No bruits.  Soft, non-tender and non-distended without masses, hepatosplenomegaly or hernias noted.  No guarding or rebound tenderness.    Neurologic:  Alert and oriented x3;  grossly normal neurologically. Skin:  Intact without significant lesions or rashes. No jaundice. Lymph Nodes:  No significant cervical adenopathy. Psych:  Alert and cooperative. Normal mood and affect.  Imaging Studies: No results found.  Assessment and Plan:   Sean [name] is a 58 y.o. y/o male has been referred for an EGD due to recent history of perforated gastric ulcer requiring surgery , likely secondary to H pylori and NSAID use. Never had a colonoscopy for cancer screening. Average risk.    Plan 1. Stop all alcohol - features of chronic pancreatitis on CT scan  2. H pylori breath  test for confirmation of eradication  3. Prilosec 40 mg once daily  4. EGD to check for ulcer healing and colonoscopy for colon cancer screening within next 2-3 weeks   I have discussed alternative options, risks & benefits,  which include, but are not limited to, bleeding, infection, perforation,respiratory complication & drug reaction.  The patient agrees with this plan & written consent will be obtained.     Follow up PRN  Dr Wyline Mood MD,MRCP(U.K)

## 2018-07-20 ENCOUNTER — Encounter: Payer: Self-pay | Admitting: Gastroenterology

## 2018-07-20 LAB — H. PYLORI BREATH TEST: H pylori Breath Test: NEGATIVE

## 2018-07-24 ENCOUNTER — Telehealth: Payer: Self-pay | Admitting: Gastroenterology

## 2018-07-24 NOTE — Telephone Encounter (Signed)
Zella Ball called stating the colonscopy with Dr Tobi Bastos for 07-26-2018 needs to be r/s.

## 2018-07-26 ENCOUNTER — Encounter: Admission: RE | Payer: Self-pay | Source: Home / Self Care

## 2018-07-26 ENCOUNTER — Ambulatory Visit: Admission: RE | Admit: 2018-07-26 | Payer: Medicaid Other | Source: Home / Self Care | Admitting: Gastroenterology

## 2018-07-26 SURGERY — COLONOSCOPY WITH PROPOFOL
Anesthesia: General

## 2018-07-31 ENCOUNTER — Other Ambulatory Visit: Payer: Self-pay

## 2018-07-31 DIAGNOSIS — B9681 Helicobacter pylori [H. pylori] as the cause of diseases classified elsewhere: Secondary | ICD-10-CM

## 2018-07-31 DIAGNOSIS — K279 Peptic ulcer, site unspecified, unspecified as acute or chronic, without hemorrhage or perforation: Principal | ICD-10-CM

## 2018-07-31 DIAGNOSIS — Z1211 Encounter for screening for malignant neoplasm of colon: Secondary | ICD-10-CM

## 2018-07-31 NOTE — Telephone Encounter (Signed)
Spoke with pt and was able to reschedule the endoscopy procedure to 08-20-18. Pt states he still has the printed prep instructions and bowel prep prescription.

## 2018-08-15 ENCOUNTER — Telehealth: Payer: Self-pay

## 2018-08-15 NOTE — Telephone Encounter (Signed)
08/20/18 Colonoscopy Canceled Per Dr. Tobi Bastos.  Pt notified.  Thanks [employer]

## 2018-08-20 ENCOUNTER — Ambulatory Visit: Admission: RE | Admit: 2018-08-20 | Payer: Medicaid Other | Source: Home / Self Care | Admitting: Gastroenterology

## 2018-08-20 ENCOUNTER — Encounter: Admission: RE | Payer: Self-pay | Source: Home / Self Care

## 2018-08-20 SURGERY — COLONOSCOPY WITH PROPOFOL
Anesthesia: General

## 2019-08-28 ENCOUNTER — Ambulatory Visit: Payer: Medicaid Other | Attending: Internal Medicine

## 2019-08-28 DIAGNOSIS — Z23 Encounter for immunization: Secondary | ICD-10-CM

## 2019-08-28 NOTE — Progress Notes (Signed)
   Covid-19 Vaccination Clinic  Name:  Sean [name]    MRN: [id number] DOB: 13-Jan-1961  08/28/2019  Mr. Sean [name] was observed post Covid-19 immunization for 15 minutes without incident. He was provided with Vaccine Information Sheet and instruction to access the V-Safe system.   Mr. Sean [name] was instructed to call 911 with any severe reactions post vaccine: Marland Kitchen Difficulty breathing  . Swelling of face and throat  . A fast heartbeat  . A bad rash all over body  . Dizziness and weakness   Immunizations Administered    Name Date Dose VIS Date Route   Pfizer COVID-19 Vaccine 08/28/2019  9:13 AM 0.3 mL 05/10/2019 Intramuscular   Manufacturer: [employer], [employer]   Lot: [id number]   NDC: [address]-9

## 2019-09-22 ENCOUNTER — Ambulatory Visit: Payer: Medicaid Other | Attending: Internal Medicine

## 2019-09-22 DIAGNOSIS — Z23 Encounter for immunization: Secondary | ICD-10-CM

## 2019-09-22 NOTE — Progress Notes (Signed)
   Covid-19 Vaccination Clinic  Name:  Sean [name]    MRN: [id number] DOB: 18-Jun-1960  09/22/2019  [name] was observed post Covid-19 immunization for 15 minutes without incident. He was provided with Vaccine Information Sheet and instruction to access the V-Safe system.   [name] was instructed to call 911 with any severe reactions post vaccine: Marland Kitchen Difficulty breathing  . Swelling of face and throat  . A fast heartbeat  . A bad rash all over body  . Dizziness and weakness   Immunizations Administered    Name Date Dose VIS Date Route   Pfizer COVID-19 Vaccine 09/22/2019  8:55 AM 0.3 mL 07/24/2018 Intramuscular   Manufacturer: [employer], [employer]   Lot: [id number]   NDC: [address]-9

## 2020-08-03 ENCOUNTER — Emergency Department: Payer: Medicaid Other

## 2020-08-03 ENCOUNTER — Encounter: Payer: Self-pay | Admitting: Emergency Medicine

## 2020-08-03 ENCOUNTER — Other Ambulatory Visit: Payer: Self-pay

## 2020-08-03 ENCOUNTER — Emergency Department
Admission: EM | Admit: 2020-08-03 | Discharge: 2020-08-03 | Disposition: A | Discharge status: Home / Self Care | Payer: Medicaid Other | Attending: Emergency Medicine | Admitting: Emergency Medicine

## 2020-08-03 DIAGNOSIS — F17210 Nicotine dependence, cigarettes, uncomplicated: Secondary | ICD-10-CM | POA: Diagnosis not present

## 2020-08-03 DIAGNOSIS — I10 Essential (primary) hypertension: Secondary | ICD-10-CM | POA: Insufficient documentation

## 2020-08-03 DIAGNOSIS — J189 Pneumonia, unspecified organism: Secondary | ICD-10-CM

## 2020-08-03 DIAGNOSIS — J181 Lobar pneumonia, unspecified organism: Secondary | ICD-10-CM | POA: Insufficient documentation

## 2020-08-03 DIAGNOSIS — R066 Hiccough: Secondary | ICD-10-CM | POA: Diagnosis present

## 2020-08-03 DIAGNOSIS — R911 Solitary pulmonary nodule: Secondary | ICD-10-CM | POA: Insufficient documentation

## 2020-08-03 DIAGNOSIS — Z20822 Contact with and (suspected) exposure to covid-19: Secondary | ICD-10-CM | POA: Insufficient documentation

## 2020-08-03 LAB — CBC WITH DIFFERENTIAL/PLATELET
Abs Immature Granulocytes: 0.25 10*3/uL — ABNORMAL HIGH (ref 0.00–0.07)
Basophils Absolute: 0.1 10*3/uL (ref 0.0–0.1)
Basophils Relative: 1 %
Eosinophils Absolute: 0.1 10*3/uL (ref 0.0–0.5)
Eosinophils Relative: 1 %
HCT: 37.4 % — ABNORMAL LOW (ref 39.0–52.0)
Hemoglobin: 13.4 g/dL (ref 13.0–17.0)
Immature Granulocytes: 3 %
Lymphocytes Relative: 12 %
Lymphs Abs: 1.2 10*3/uL (ref 0.7–4.0)
MCH: 35.3 pg — ABNORMAL HIGH (ref 26.0–34.0)
MCHC: 35.8 g/dL (ref 30.0–36.0)
MCV: 98.4 fL (ref 80.0–100.0)
Monocytes Absolute: 1.1 10*3/uL — ABNORMAL HIGH (ref 0.1–1.0)
Monocytes Relative: 11 %
Neutro Abs: 6.8 10*3/uL (ref 1.7–7.7)
Neutrophils Relative %: 72 %
Platelets: 294 10*3/uL (ref 150–400)
RBC: 3.8 MIL/uL — ABNORMAL LOW (ref 4.22–5.81)
RDW: 13.5 % (ref 11.5–15.5)
WBC: 9.4 10*3/uL (ref 4.0–10.5)
nRBC: 0 % (ref 0.0–0.2)

## 2020-08-03 LAB — COMPREHENSIVE METABOLIC PANEL
ALT: 12 U/L (ref 0–44)
AST: 17 U/L (ref 15–41)
Albumin: 2.2 g/dL — ABNORMAL LOW (ref 3.5–5.0)
Alkaline Phosphatase: 132 U/L — ABNORMAL HIGH (ref 38–126)
Anion gap: 9 (ref 5–15)
BUN: <5 mg/dL — ABNORMAL LOW (ref 8–23)
CO2: 32 mmol/L (ref 22–32)
Calcium: 8 mg/dL — ABNORMAL LOW (ref 8.9–10.3)
Chloride: 92 mmol/L — ABNORMAL LOW (ref 98–111)
Creatinine, Ser: 0.63 mg/dL (ref 0.61–1.24)
GFR, Estimated: >60 mL/min (ref >60)
Glucose, Bld: 137 mg/dL — ABNORMAL HIGH (ref 70–99)
Potassium: 3.3 mmol/L — ABNORMAL LOW (ref 3.5–5.1)
Sodium: 133 mmol/L — ABNORMAL LOW (ref 135–145)
Total Bilirubin: 0.4 mg/dL (ref 0.3–1.2)
Total Protein: 6.7 g/dL (ref 6.5–8.1)

## 2020-08-03 LAB — SARS CORONAVIRUS 2 (TAT 6-24 HRS): SARS Coronavirus 2: NEGATIVE

## 2020-08-03 MED ORDER — SODIUM CHLORIDE 0.9 % IV SOLN
1.0000 g | Freq: Once | INTRAVENOUS | Status: AC
  Administered 2020-08-03: 1 g via INTRAVENOUS
  Filled 2020-08-03: qty 10

## 2020-08-03 MED ORDER — GABAPENTIN 300 MG PO CAPS
300.0000 mg | ORAL_CAPSULE | Freq: Once | ORAL | Status: AC
  Administered 2020-08-03: 300 mg via ORAL
  Filled 2020-08-03: qty 1

## 2020-08-03 MED ORDER — BACLOFEN 10 MG PO TABS
10.0000 mg | ORAL_TABLET | Freq: Once | ORAL | Status: AC
  Administered 2020-08-03: 10 mg via ORAL
  Filled 2020-08-03: qty 1

## 2020-08-03 MED ORDER — IOHEXOL 300 MG/ML  SOLN
75.0000 mL | Freq: Once | INTRAMUSCULAR | Status: AC | PRN
  Administered 2020-08-03: 75 mL via INTRAVENOUS
  Filled 2020-08-03: qty 75

## 2020-08-03 MED ORDER — LEVOFLOXACIN 500 MG PO TABS: 500.0000 mg | ORAL_TABLET | Freq: Every day | ORAL | 0 refills | Status: AC

## 2020-08-03 NOTE — Discharge Instructions (Addendum)
The cancer center will call you to schedule appointment for follow-up due to the pulmonary nodule and area of concern for cancer. At this time we do not know if you have cancer if this is an infectious process.  Please take the antibiotic as prescribed.  Return emergency department if you are worsening.

## 2020-08-03 NOTE — ED Notes (Signed)
See triage note  Presents with cough for about 1 week   States cough has been occasionally prod  No fever   Also has the hiccups

## 2020-08-03 NOTE — ED Provider Notes (Signed)
[facility] Emergency Department Provider Note  ____________________________________________   Event Date/Time   First MD Initiated Contact with Patient 08/03/20 1212     (approximate)  I have reviewed the triage vital signs and the nursing notes.   HISTORY  Chief Complaint Cough and Hiccups    HPI Sean [name] is a 60 y.o. male presents emergency department complaining of a cough for 1 week and hiccups for approximately 1 week.  No fever or chills.  Patient is vaccinated x2 for Covid.  No booster.  Denies known fever.  Unsure of sick contacts as his son states he has buddies, overall time to drink.  No vomiting or diarrhea.    Past Medical History:  Diagnosis Date  . Anxiety   . Arthritis   . Depression   . GERD (gastroesophageal reflux disease)   . Hypertension   . Pancreatitis   . Pneumonia   . Substance abuse [facility])     Patient Active Problem List   Diagnosis Date Noted  . Perforated ulcer ([place]) 03/03/2018  . Perforation bowel ([place])   . Pancreatitis   . Abnormal LFTs 02/19/2014    Past Surgical History:  Procedure Laterality Date  . gallstone removal    . LAPAROTOMY N/A 03/03/2018   Procedure: EXPLORATORY LAPAROTOMY with  graham patch;  Surgeon: Jules Husbands, MD;  Location: [facility] ORS;  Service: General;  Laterality: N/A;    Prior to Admission medications   Medication Sig Start Date End Date Taking? Authorizing Provider  levofloxacin (LEVAQUIN) 500 MG tablet Take 1 tablet (500 mg total) by mouth daily for 10 days. 08/03/20 08/13/20 Yes Pricsilla Lindvall, Linden Dolin, PA-C  allopurinol (ZYLOPRIM) 300 MG tablet Take 1 tablet (300 mg total) by mouth Two (2) times a day. 11/03/16   [provider]  ALPHAGAN P 0.1 % SOLN Administer 1 drop into the left eye Two (2) times a day. 11/03/16   [provider]  atorvastatin (LIPITOR) 40 MG tablet Take 1 tablet (40 mg total) by mouth daily. For cholesterol 11/03/16   [provider]  BISACODYL  5 MG EC tablet TAKE ONE TABLET BY MOUTH EVERY DAY AS NEEDED FOR CONSTIPATION 11/17/16   [provider]  dorzolamide-timolol (COSOPT) 22.3-6.8 MG/ML ophthalmic solution Administer 1 drop to both eyes Two (2) times a day. 11/03/16   [provider]  fluticasone (FLONASE) 50 MCG/ACT nasal spray Place 2 sprays into both nostrils daily. 11/03/16   [provider]  omeprazole (PRILOSEC) 40 MG capsule Take 1 capsule (40 mg total) by mouth daily. 07/18/18   Jonathon Bellows, MD  polyethylene glycol powder (GLYCOLAX/MIRALAX) powder MIX 17 G WITH 8 OZ OF FLUID AND TAKE BY MOUTH ONCE DAILY AS NEEDED 11/03/16   [provider]  prednisoLONE acetate (PRED FORTE) 1 % ophthalmic suspension INSTILL 1 DROP IN RIGHT EYE 4 TIMES A DAY 01/20/17   [provider]  tamsulosin (FLOMAX) 0.4 MG CAPS capsule Take 1 capsule (0.4 mg total) by mouth daily. 11/03/16   [provider]  TRAVATAN Z 0.004 % SOLN ophthalmic solution Administer 1 drop to both eyes every evening. 11/03/16   [provider]  VOLTAREN 1 % GEL APPLY 2 GRAMS TO JOINTS 3 TO 4 TIMES DAILY AS NEEDED FOR PAIN 10/06/16   [provider]    Allergies Patient has no known allergies.  Family History  Problem Relation Age of Onset  . Alcohol abuse Mother   . Arthritis Mother   .  Hypertension Mother   . Varicose Veins Mother   . Alcohol abuse Father   . Cancer Father   . Early death Father   . Alcohol abuse Sister   . Cancer Sister   . COPD Sister   . Depression Sister   . Early death Sister   . Heart disease Sister   . Varicose Veins Sister   . Birth defects Brother   . Diabetes Brother   . Learning disabilities Brother   . Mental illness Brother   . Mental retardation Brother   . Vision loss Brother     Social History Social History   Tobacco Use  . Smoking status: Current Every Day Smoker    Packs/day: 0.50    Years: 15.00    Pack years: 7.50    Types: Cigarettes  . Smokeless  tobacco: Never Used  Vaping Use  . Vaping Use: Never used  Substance Use Topics  . Alcohol use: Yes    Alcohol/week: 84.0 standard drinks    Types: 84 Cans of beer per week    Comment: 40 oz four times/day  . Drug use: Yes    Types: Cocaine, Marijuana    Comment: Infrequent.    Review of Systems  Constitutional: No fever/chills Eyes: No visual changes. ENT: No sore throat. Respiratory: Positive cough and hiccups Cardiovascular: Denies chest pain Gastrointestinal: Denies abdominal pain Genitourinary: Negative for dysuria. Musculoskeletal: Negative for back pain. Skin: Negative for rash. Psychiatric: no mood changes,     ____________________________________________   PHYSICAL EXAM:  VITAL SIGNS: ED Triage Vitals  Enc Vitals Group     BP 08/03/20 1137 (!) 126/101     Pulse Rate 08/03/20 1137 (!) 104     Resp 08/03/20 1137 20     Temp 08/03/20 1137 98 F (36.7 C)     Temp Source 08/03/20 1137 Oral     SpO2 08/03/20 1137 97 %     Weight 08/03/20 1138 115 lb (52.2 kg)     Height 08/03/20 1138 '5\' 6"'  (1.676 m)     Head Circumference --      Peak Flow --      Pain Score 08/03/20 1137 0     Pain Loc --      Pain Edu? --      Excl. in [place]? --     Constitutional: Alert and oriented. Well appearing and in no acute distress. Eyes: Conjunctivae are normal.  Head: Atraumatic. Nose: No congestion/rhinnorhea. Mouth/Throat: Mucous membranes are moist.   Neck:  supple no lymphadenopathy noted Cardiovascular: Normal rate, regular rhythm. Heart sounds are normal Respiratory: Normal respiratory effort.  No retractions, lungs c t a, positive pickups quite frequently GU: deferred Musculoskeletal: FROM all extremities, warm and well perfused Neurologic:  Normal speech and language.  Skin:  Skin is warm, dry and intact. No rash noted. Psychiatric: Mood and affect are normal. Speech and behavior are normal.  ____________________________________________   LABS (all labs  ordered are listed, but only abnormal results are displayed)  Labs Reviewed  COMPREHENSIVE METABOLIC PANEL - Abnormal; Notable for the following components:      Result Value   Sodium 133 (*)    Potassium 3.3 (*)    Chloride 92 (*)    Glucose, Bld 137 (*)    BUN <5 (*)    Calcium 8.0 (*)    Albumin 2.2 (*)    Alkaline Phosphatase 132 (*)    All other components within normal limits  CBC WITH  DIFFERENTIAL/PLATELET - Abnormal; Notable for the following components:   RBC 3.80 (*)    HCT 37.4 (*)    MCH 35.3 (*)    Monocytes Absolute 1.1 (*)    Abs Immature Granulocytes 0.25 (*)    All other components within normal limits  SARS CORONAVIRUS 2 (TAT 6-24 HRS)   ____________________________________________   ____________________________________________  RADIOLOGY  Chest x-ray CT of the chest with contrast  ____________________________________________   PROCEDURES  Procedure(s) performed: No  Procedures    ____________________________________________   INITIAL IMPRESSION / ASSESSMENT AND PLAN / ED COURSE  Pertinent labs & imaging results that were available during my care of the patient were reviewed by me and considered in my medical decision making (see chart for details).   Patient 60 year old male presents with cough and hiccups.  See HPI.  Physical exam shows patient appear well.  Vitals are stable.  Patient does have hiccups and dry throat  Chest x-ray Baclofen and gabapentin for hiccups Covid swab   Patient's hiccups have resolved with the baclofen and gabapentin.  Chest x-ray reviewed by me confirmed by radiology appears to have a questionable pneumonia versus cancerous lesion radiologist is recommending CT of the chest  CT of the chest ordered CBC is basically normal, comprehensive metabolic panel shows glucose increased to 137 alk phos is increased to 132, sodium and potassium are slightly depleted Covid test pending  CT of the chest shows the same  as the x-ray as the radiologist still unsure if this is infectious versus cancerous lesion.  Pulmonary nodules also noted.  Consult to pulmonary nodule ambulatory clinic.  Nursing staff there states they will reach out to him.  All information was discussed with the patient.  He was given a prescription for Levaquin 500 daily for 10 days.  He was given Rocephin 1 g IV here in the ED.  Follow-up with pulmonology and cancer center.  He was discharged in stable condition.  Jayjay Littles Pepper was evaluated in Emergency Department on 08/03/2020 for the symptoms described in the history of present illness. He was evaluated in the context of the global COVID-19 pandemic, which necessitated consideration that the patient might be at risk for infection with the SARS-CoV-2 virus that causes COVID-19. Institutional protocols and algorithms that pertain to the evaluation of patients at risk for COVID-19 are in a state of rapid change based on information released by regulatory bodies including the CDC and federal and state organizations. These policies and algorithms were followed during the patient's care in the ED.    As part of my medical decision making, I reviewed the following data within the [place] History obtained from family, Nursing notes reviewed and incorporated, Labs reviewed , Old chart reviewed, Radiograph reviewed , Notes from prior ED visits and [place] Controlled Substance Database  ____________________________________________   FINAL CLINICAL IMPRESSION(S) / ED DIAGNOSES  Final diagnoses:  Pulmonary nodule  Community acquired pneumonia of left lower lobe of lung      NEW MEDICATIONS STARTED DURING THIS VISIT:  New Prescriptions   LEVOFLOXACIN (LEVAQUIN) 500 MG TABLET    Take 1 tablet (500 mg total) by mouth daily for 10 days.     Note:  This document was prepared using Dragon voice recognition software and may include unintentional dictation errors.    Versie Starks, PA-C 08/03/20 1539    Lucrezia Starch, MD 08/03/20 [phone]

## 2020-08-03 NOTE — ED Triage Notes (Signed)
Pt via POV from home. Pt has had a productive cough for over a week. Pt is coughing up clear phlegm. Pt states that he also has been having some hiccups. Denies CP and SOB. Denies fever. Pt is A&Ox4 and NAD.

## 2020-08-04 ENCOUNTER — Telehealth: Payer: Self-pay | Admitting: *Deleted

## 2020-08-04 NOTE — Telephone Encounter (Signed)
Patient called stating thew we are supposed to be setting him up for a scan.  I do see where he has been referred for the Lung Nodule Clinic Please return his call

## 2020-08-04 NOTE — Telephone Encounter (Signed)
Referral received. I am waiting on recommendations from [place] before scheduling him for his appt. I will give him a call to inform him. Thank you.

## 2020-08-05 ENCOUNTER — Other Ambulatory Visit: Payer: Self-pay | Admitting: Oncology

## 2020-08-05 ENCOUNTER — Telehealth: Payer: Self-pay | Admitting: *Deleted

## 2020-08-05 DIAGNOSIS — R911 Solitary pulmonary nodule: Secondary | ICD-10-CM

## 2020-08-05 NOTE — Progress Notes (Signed)
  Pulmonary Nodule Clinic Telephone Note [facility]   HPI: Patient was recently evaluated in the emergency room for cough.  Work-up included imaging which showed a large left lower lobe cavitary lesion possibly representing infection.  CT chest showed large thick walled left lower lobe cavitary lesion.  While this may be infectious in etiology the presence of an underlying neoplastic process cannot be excluded.  He was discharged home on antibiotics.  Review and Recommendations: I personally reviewed all patient's previous imaging including most recent chest x-ray and CT scan.  I recommend follow-up with non-contrast CT chest in 6 to 8 weeks.  Social History: Tobacco Use: High Risk  . Smoking Tobacco Use: Current Every Day Smoker  . Smokeless Tobacco Use: Never Used    High risk factors include: History of heavy smoking, exposure to asbestos, radium or uranium, personal family history of lung cancer, older age, sex (females greater than males), race (black and native [place] greater than weight), marginal speculation, upper lobe location, multiplicity (less than 5 nodules increases risk for malignancy) and emphysema and/or pulmonary fibrosis.   This recommendation follows the consensus statement: Guidelines for Management of Incidental Pulmonary Nodules Detected on CT Images: From the Fleischner Society 2017; Radiology 2017; 284:228-243.    I have placed order for CT scan without contrast to be completed in 6 to 8 weeks.   Disposition: Order placed for repeat CT chest. Will notify Micael Hampshire in scheduling. [address] to call patient with appointment date and time. Return to pulmonary nodule clinic a few days after his repeat imaging to discuss results and plan moving forward.  Durenda Hurt, NP 08/05/2020 5:41 AM

## 2020-08-05 NOTE — Telephone Encounter (Signed)
Pt made aware of referral to Lung Nodule Clinic from PCP. Pt is in agreement to have upcoming CT scan and follow up as recommended.  Pt has been made aware of upcoming appts for follow up CT scan and follow up appt with Jennifer Burns, NP in the Lung Nodule Clinic. Pt verbalized understanding. Nothing further needed at this time.  

## 2020-09-14 ENCOUNTER — Ambulatory Visit: Payer: Medicaid Other

## 2020-09-15 ENCOUNTER — Other Ambulatory Visit: Payer: Self-pay

## 2020-09-15 ENCOUNTER — Ambulatory Visit
Admission: RE | Admit: 2020-09-15 | Discharge: 2020-09-15 | Disposition: A | Discharge status: Home / Self Care | Payer: Medicaid Other | Source: Ambulatory Visit | Attending: Oncology | Admitting: Oncology

## 2020-09-15 DIAGNOSIS — R911 Solitary pulmonary nodule: Secondary | ICD-10-CM | POA: Insufficient documentation

## 2020-09-16 ENCOUNTER — Inpatient Hospital Stay: Payer: Medicaid Other | Attending: Oncology | Admitting: Oncology

## 2020-10-05 ENCOUNTER — Telehealth: Payer: Self-pay | Admitting: *Deleted

## 2020-10-05 NOTE — Telephone Encounter (Signed)
Pt did not show for follow up visit in the lung nodule clinic to discuss CT results. Attempted to contact pt to reschedule with no answer and unable to leave message. Will try again later.

## 2020-10-09 NOTE — Telephone Encounter (Signed)
Pt scheduled for virtual visit on 5/16 @ 230pm with Boneta Lucks. Pt confirmed appt.

## 2020-10-12 ENCOUNTER — Inpatient Hospital Stay: Payer: Medicaid Other | Attending: Oncology | Admitting: Oncology

## 2020-10-12 DIAGNOSIS — R911 Solitary pulmonary nodule: Secondary | ICD-10-CM

## 2020-10-12 DIAGNOSIS — Z87891 Personal history of nicotine dependence: Secondary | ICD-10-CM

## 2020-10-12 NOTE — Progress Notes (Signed)
Pulmonary Nodule Clinic Consult note [facility]  [phone] [phone]  Patient Care Team: [facility] - General (General Practice)   Name of the patient: Sean [name]  [id number]  11/01/1960   Date of visit: 10/12/2020   Diagnosis- Lung Nodule  Virtual Visit via Telephone Note   I connected with Sean [name] on 10/12/20 at 2:30pm by telephone visit and verified that I am speaking with the correct person using two identifiers.   I discussed the limitations, risks, security and privacy concerns of performing an evaluation and management service by telemedicine and the availability of in-person appointments. I also discussed with the patient that there may be a patient responsible charge related to this service. The patient expressed understanding and agreed to proceed.   Other persons participating in the visit and their role in the encounter:   Patient's location: Home  Provider's location: Clinic   Chief complaint/ Reason for visit- Pulmonary Nodule Clinic Initial Visit  Past Medical History:  Patient was recently evaluated in the emergency room for cough.  Work-up included imaging which showed a large left lower lobe cavitary lesion possibly representing infection.  CT chest showed large thick walled left lower lobe cavitary lesion.  While this may be infectious in etiology the presence of an underlying neoplastic process cannot be excluded.  He was discharged home on antibiotics  Interval history-he presents today via telephone to discuss most recent CT scan.  Reports overall doing well since his admission.  Has episodes of uncontrollable coughing.  Feels symptoms have improved some since treatment with antibiotics in March 2022.  He continues to smoke and smokes half a pack of cigarettes per day for greater than 15 years.  He has some balance concerns and has "toppled over" several times the past few  months.  Currently disabled.  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  He also mowed lawns for several years.  Family hx of sister died throat cancer- No cancer   Denies any neurologic complaints. Denies recent fevers or illnesses. Denies any easy bleeding or bruising. Reports good appetite and denies weight loss. Denies chest pain. Denies any nausea, vomiting, constipation, or diarrhea. Denies urinary complaints. Patient offers no further specific complaints today.   ECOG FS:0 - Asymptomatic  Review of systems- Review of Systems  Constitutional: Negative.  Negative for chills, fever, malaise/fatigue and weight loss.  HENT: Negative for congestion, ear pain and tinnitus.   Eyes: Negative.  Negative for blurred vision and double vision.  Respiratory: Positive for cough. Negative for sputum production and shortness of breath.   Cardiovascular: Negative.  Negative for chest pain, palpitations and leg swelling.  Gastrointestinal: Negative.  Negative for abdominal pain, constipation, diarrhea, nausea and vomiting.  Genitourinary: Negative for dysuria, frequency and urgency.  Musculoskeletal: Positive for falls. Negative for back pain.  Skin: Negative.  Negative for rash.  Neurological: Positive for dizziness and weakness. Negative for headaches.  Endo/Heme/Allergies: Negative.  Does not bruise/bleed easily.  Psychiatric/Behavioral: Negative.  Negative for depression. The patient is not nervous/anxious and does not have insomnia.      No Known Allergies   Past Medical History:  Diagnosis Date  . Anxiety   . Arthritis   . Depression   . GERD (gastroesophageal reflux disease)   . Hypertension   . Pancreatitis   . Pneumonia   . Substance abuse [facility](HCC)      Past Surgical History:  Procedure Laterality Date  .  gallstone removal    . LAPAROTOMY N/A 03/03/2018   Procedure: EXPLORATORY LAPAROTOMY with  graham patch;  Surgeon: Leafy Ro, MD;  Location: [facility] ORS;   Service: General;  Laterality: N/A;    Social History   Socioeconomic History  . Marital status: Single    Spouse name: Not on file  . Number of children: Not on file  . Years of education: Not on file  . Highest education level: Not on file  Occupational History  . Not on file  Tobacco Use  . Smoking status: Current Every Day Smoker    Packs/day: 0.50    Years: 15.00    Pack years: 7.50    Types: Cigarettes  . Smokeless tobacco: Never Used  Vaping Use  . Vaping Use: Never used  Substance and Sexual Activity  . Alcohol use: Yes    Alcohol/week: 84.0 standard drinks    Types: 84 Cans of beer per week    Comment: 40 oz four times/day  . Drug use: Yes    Types: Cocaine, Marijuana    Comment: Infrequent.  . Sexual activity: Never  Other Topics Concern  . Not on file  Social History Narrative  . Not on file   Social Determinants of Health   Financial Resource Strain: Not on file  Food Insecurity: Not on file  Transportation Needs: Not on file  Physical Activity: Not on file  Stress: Not on file  Social Connections: Not on file  Intimate Partner Violence: Not on file    Family History  Problem Relation Age of Onset  . Alcohol abuse Mother   . Arthritis Mother   . Hypertension Mother   . Varicose Veins Mother   . Alcohol abuse Father   . Cancer Father   . Early death Father   . Alcohol abuse Sister   . Cancer Sister   . COPD Sister   . Depression Sister   . Early death Sister   . Heart disease Sister   . Varicose Veins Sister   . Birth defects Brother   . Diabetes Brother   . Learning disabilities Brother   . Mental illness Brother   . Mental retardation Brother   . Vision loss Brother      Current Outpatient Medications:  .  allopurinol (ZYLOPRIM) 300 MG tablet, Take 1 tablet (300 mg total) by mouth Two (2) times a day., Disp: , Rfl: 3 .  ALPHAGAN P 0.1 % SOLN, Administer 1 drop into the left eye Two (2) times a day., Disp: , Rfl: 99 .   atorvastatin (LIPITOR) 40 MG tablet, Take 1 tablet (40 mg total) by mouth daily. For cholesterol, Disp: , Rfl: 3 .  BISACODYL 5 MG EC tablet, TAKE ONE TABLET BY MOUTH EVERY DAY AS NEEDED FOR CONSTIPATION, Disp: , Rfl: 11 .  dorzolamide-timolol (COSOPT) 22.3-6.8 MG/ML ophthalmic solution, Administer 1 drop to both eyes Two (2) times a day., Disp: , Rfl: 99 .  fluticasone (FLONASE) 50 MCG/ACT nasal spray, Place 2 sprays into both nostrils daily., Disp: , Rfl: 11 .  omeprazole (PRILOSEC) 40 MG capsule, Take 1 capsule (40 mg total) by mouth daily., Disp: 90 capsule, Rfl: 0 .  polyethylene glycol powder (GLYCOLAX/MIRALAX) powder, MIX 17 G WITH 8 OZ OF FLUID AND TAKE BY MOUTH ONCE DAILY AS NEEDED, Disp: , Rfl: 5 .  prednisoLONE acetate (PRED FORTE) 1 % ophthalmic suspension, INSTILL 1 DROP IN RIGHT EYE 4 TIMES A DAY, Disp: , Rfl: 1 .  tamsulosin (FLOMAX) 0.4 MG CAPS capsule, Take 1 capsule (0.4 mg total) by mouth daily., Disp: , Rfl: 3 .  TRAVATAN Z 0.004 % SOLN ophthalmic solution, Administer 1 drop to both eyes every evening., Disp: , Rfl: 99 .  VOLTAREN 1 % GEL, APPLY 2 GRAMS TO JOINTS 3 TO 4 TIMES DAILY AS NEEDED FOR PAIN, Disp: , Rfl: 3  Physical exam: There were no vitals filed for this visit. Physical Exam Neurological:     Mental Status: He is alert and oriented to person, place, and time.      CMP Latest Ref Rng & Units 08/03/2020  Glucose 70 - 99 mg/dL 038(B)  BUN 8 - 23 mg/dL <3(X)  Creatinine 8.32 - 1.24 mg/dL 9.19  Sodium 166 - 060 mmol/L 133(L)  Potassium 3.5 - 5.1 mmol/L 3.3(L)  Chloride 98 - 111 mmol/L 92(L)  CO2 22 - 32 mmol/L 32  Calcium 8.9 - 10.3 mg/dL 8.0(L)  Total Protein 6.5 - 8.1 g/dL 6.7  Total Bilirubin 0.3 - 1.2 mg/dL 0.4  Alkaline Phos 38 - 126 U/L 132(H)  AST 15 - 41 U/L 17  ALT 0 - 44 U/L 12   CBC Latest Ref Rng & Units 08/03/2020  WBC 4.0 - 10.5 K/uL 9.4  Hemoglobin 13.0 - 17.0 g/dL 04.5  Hematocrit 99.7 - 52.0 % 37.4(L)  Platelets 150 - 400 K/uL 294    No  images are attached to the encounter.  CT Chest Wo Contrast  Result Date: 09/15/2020 CLINICAL DATA:  Current smoker. Follow-up of pulmonary nodule. No prior surgery. EXAM: CT CHEST WITHOUT CONTRAST TECHNIQUE: Multidetector CT imaging of the chest was performed following the standard protocol without IV contrast. COMPARISON:  08/03/2020 FINDINGS: Cardiovascular: Upper normal ascending aortic size 3.8 cm. Normal heart size, without pericardial effusion. Mediastinum/Nodes: No mediastinal or definite hilar adenopathy, given limitations of unenhanced CT. Subtle fluid level in the esophagus on 77/2. Lungs/Pleura: Near complete resolution of left-sided pleural effusion with minimal/trace loculated fluid or thickening remaining. Mild centrilobular emphysema. Right middle lobe subpleural 3 mm nodule on 84/3 and right lower lobe pleural-based 4 mm nodule on 111/3 are not significantly changed. A 4 mm lingular nodule on 84/3 is unchanged. Significant improvement in the superior segment left lower lobe cavitary process, favoring cavitary pneumonia. Measures on the order of 4.8 x 4.1 cm on 82/3 today versus 8.9 x 5.3 cm on the prior exam. Significantly decrease in wall thickness. Near complete resolution of surrounding airspace and ground-glass opacity. Upper Abdomen: Hepatic steatosis. Moderate cirrhosis. Chronic calcific pancreatitis. Normal imaged portions of the spleen, adrenal glands, kidneys. Musculoskeletal: Mild right-sided gynecomastia. Bilateral nonacute rib fractures. IMPRESSION: 1. Significantly improved superior segment left lower lobe cavitary process, most consistent with resolving cavitary pneumonia. 2.  No acute process in the chest. 3. Tiny pulmonary nodules. Given risk factors for bronchogenic carcinoma, follow-up CT at twelve months should be considered. This recommendation follows the consensus statement: Guidelines for Management of Incidental Pulmonary Nodules Detected on CT Images:From the Fleischner  Society 2017; published online before print (10.1148/[id number]). 4.  Emphysema (ICD10-J43.9). 5. Hepatic steatosis, cirrhosis, and chronic calcific pancreatitis. 6. Esophageal air fluid level suggests dysmotility or gastroesophageal reflux. Electronically Signed   By: Jeronimo Greaves M.D.   On: 09/15/2020 15:51     Assessment and plan- Patient is a 60 y.o. male who presents to pulmonary nodule clinic for follow-up of incidental lung nodules.  A telephone visit was conducted to review most recent CT scan results.  CT chest without contrast from today showed improvement of left lower cavitary process most consistent with resolving cavitary pneumonia.  No acute process in the chest.  Tiny pulmonary nodules.  Given risk factors for bronchogenic carcinoma follow-up CT at 12 months should be considered.  Incidental findings of emphysema and hepatic steatosis, cirrhosis and chronic calcified pancreatitis.   Calculating malignancy probability of a pulmonary nodule: Risk factors include: 1.  Age. 2.  Cancer history. 3.  Diameter of pulmonary nodule and mm 4.  Location 5.  Smoking history 6.  Spiculation present   Based on risk factors, this patient is  high risk for the development of lung cancer.  I would recommend repeat imaging in 12 months and referral to the low-dose CT screening program.   During our visit, we discussed pulmonary nodules are a common incidental finding and are often how lung cancer is discovered.  Lung cancer survival is directly related to the stage at diagnosis.  We discussed that nodules can vary in presentation from solitary pulmonary nodules to masses, 2 groundglass opacities and multiple nodules.  Pulmonary nodules in the majority of cases are benign but the probability of these becoming malignant cannot be undermined.  Early identification of malignant nodules could lead to early diagnosis and increased survival.   We discussed the probability of pulmonary nodules  becoming malignant increase with age, pack years of tobacco use, size/characteristics of the nodule and location; with upper lobe involvement being most worrisome.   We discussed the goal of our clinic is to thoroughly evaluate each nodule, developed a comprehensive, individualized plan of care utilizing the most advanced technology and significantly reduce the time from detection to treatment.  A dedicated pulmonary nodule clinic has proven to indeed expedite the detection and treatment of lung cancer.   Patient education in fact sheet provided along with most recent CT scans.  Assessment: Sean [name] is a 60 year old male who presents to review most recent CT scan for follow-up of a lung nodule discovered incidentally.    Tobacco abuse: Encourage cessation. Low-dose CT screening program Smokes half a pack per day  Cough: Since diagnosis with pneumonia in March 2022. Continues to cough up yellow sputum. Gets choked easily. CT scan shows emphysema. Referral to pulmonology.  Plan: Review most recent CT scan. Reviewed past medical history. Referral to pulmonology Referral to low-dose CT screening program. Repeat CT scan in 1 year.   Visit Diagnosis 1. Lung nodule   2. Personal history of tobacco use, presenting hazards to health     Patient expressed understanding and was in agreement with this plan. He also understands that He can call clinic at any time with any questions, concerns, or complaints.   Greater than 50% was spent in counseling and coordination of care with this patient including but not limited to discussion of the relevant topics above (See A&P) including, but not limited to diagnosis and management of acute and chronic medical conditions.   Thank you for allowing me to participate in the care of this very pleasant patient.    Mauro Kaufmann, NP [facility] Cell - [phone] Pager- [phone] 10/12/2020 3:01 PM

## 2020-11-27 DEATH — deceased

## 2022-10-29 IMAGING — CT CT CHEST W/O CM
2 of 4 series · 15 of 36 positions shown, 18 images · non-contrast
Comparison: 08/03/2020

CLINICAL DATA: Current smoker. Follow-up of pulmonary nodule. No
prior surgery.

EXAM:
CT CHEST WITHOUT CONTRAST
TECHNIQUE: Multidetector CT imaging of the chest was performed following the
standard protocol without IV contrast.

[Series 2: chest 2.00 · axial · 0.67mm/px · z∈[-1409,-1137]mm · 12 of 162 slices shown, 15 images]
[im 13/162  mediastinal]
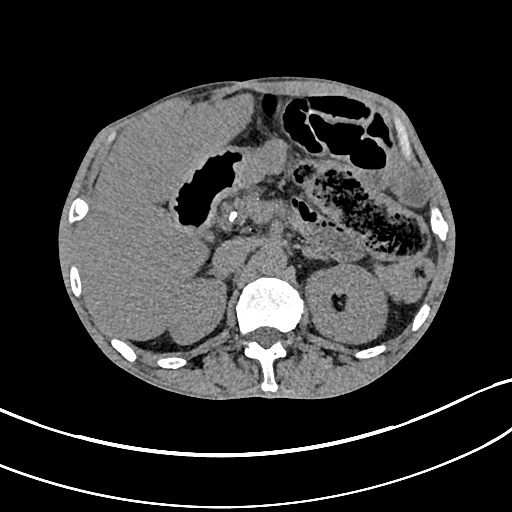
[im 13/162  lung]
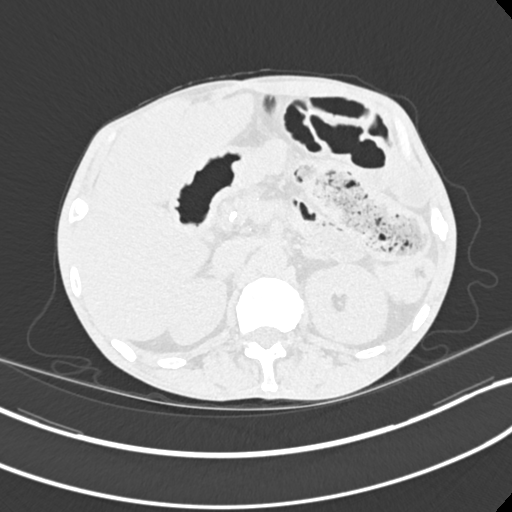
[im 25/162  lung]
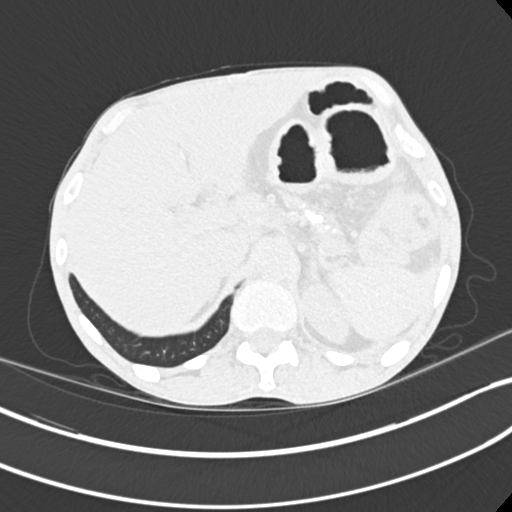
[im 38/162  lung]
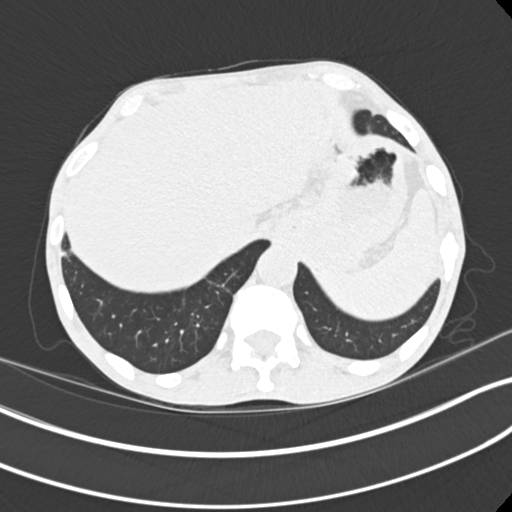
[im 50/162  lung]
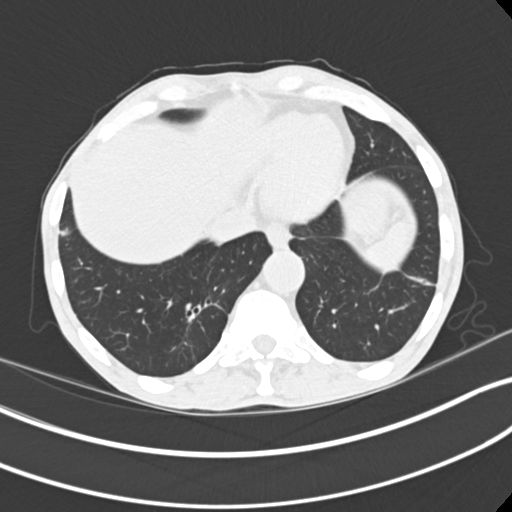
[im 62/162  mediastinal]
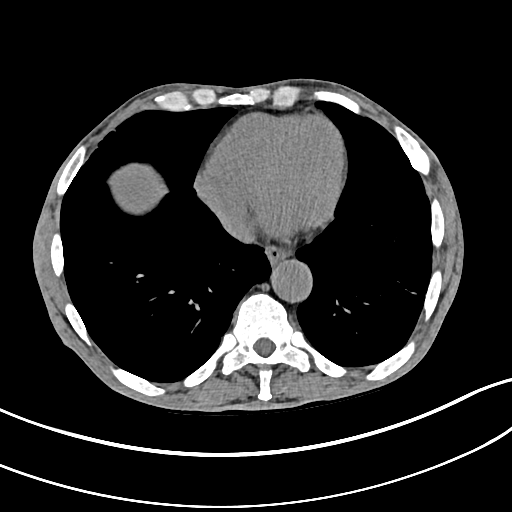
[im 62/162  lung]
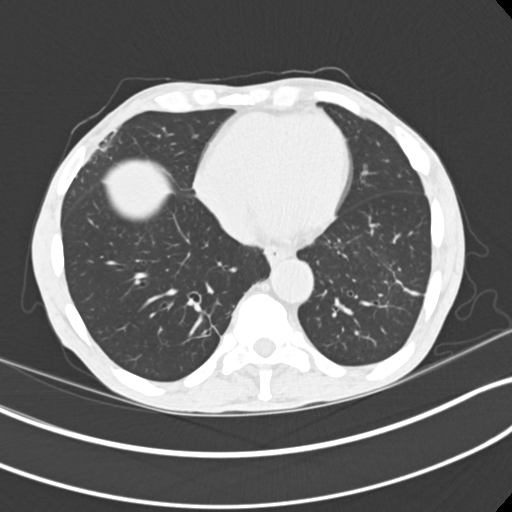
[im 75/162  lung]
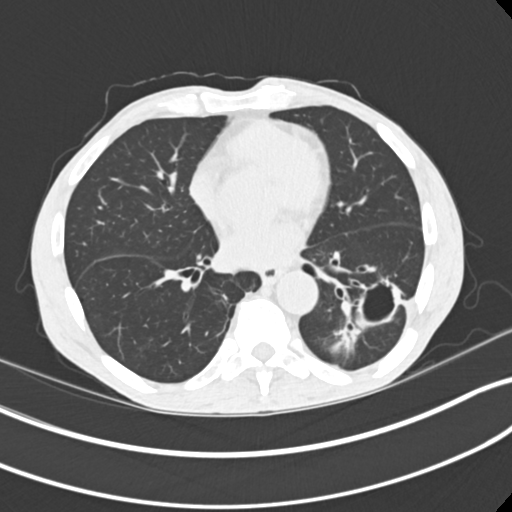
[im 87/162  lung]
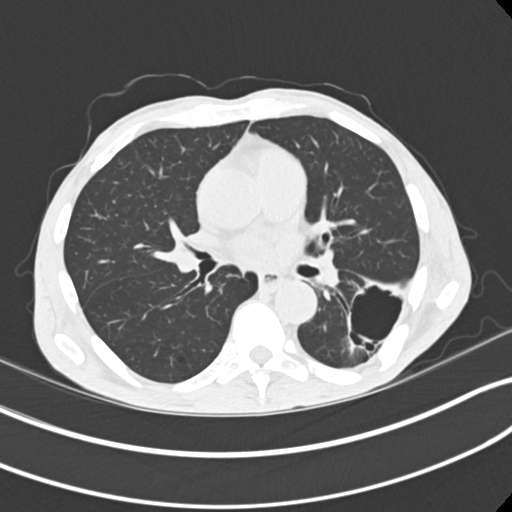
[im 100/162  lung]
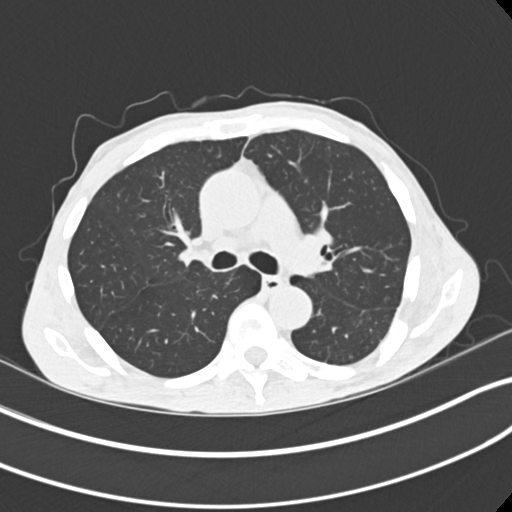
[im 112/162  mediastinal]
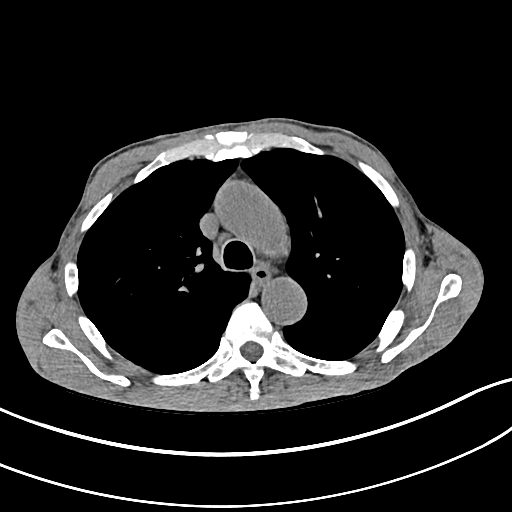
[im 112/162  lung]
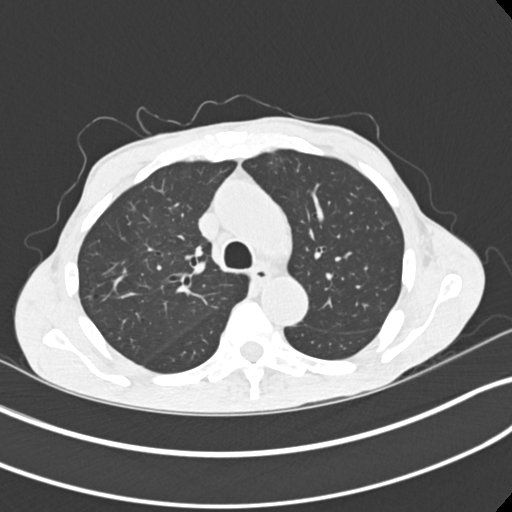
[im 124/162  lung]
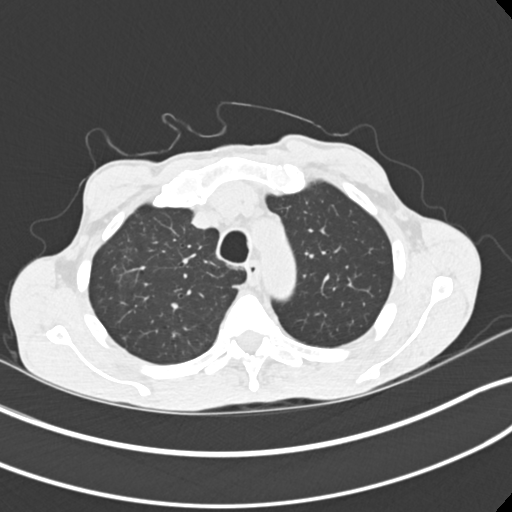
[im 137/162  lung]
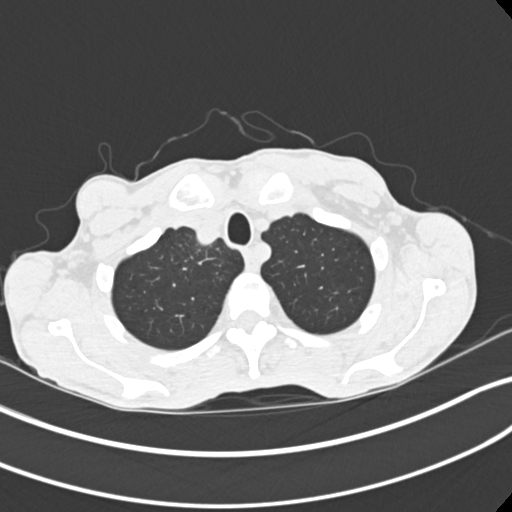
[im 149/162  lung]
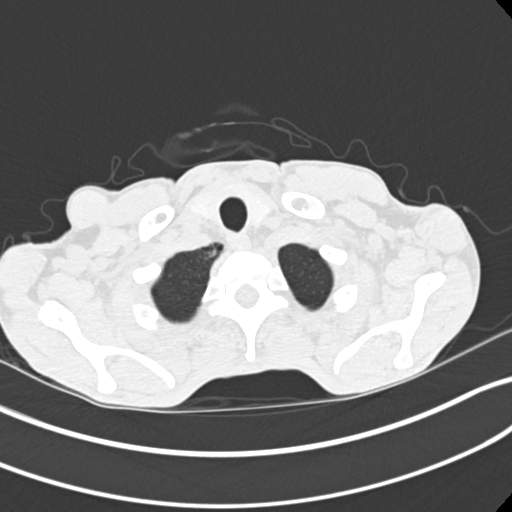

[Series 5: coronals chest 2.00 cor · coronal · 0.64mm/px · 3 of 146 slices shown]
[im 30/146  lung]
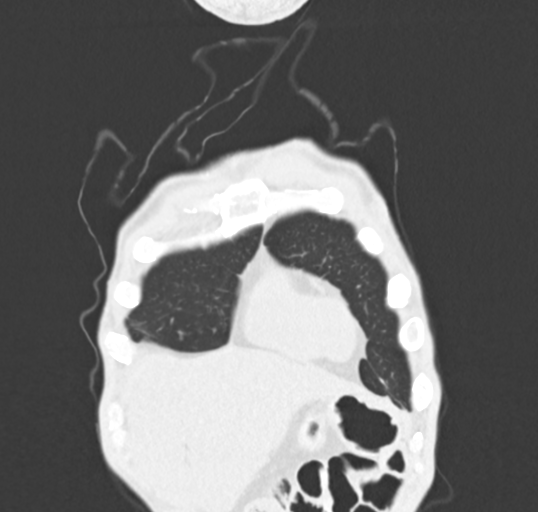
[im 59/146  lung]
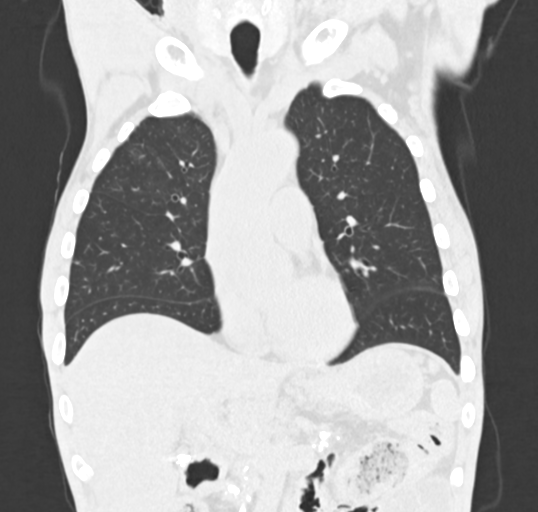
[im 88/146  lung]
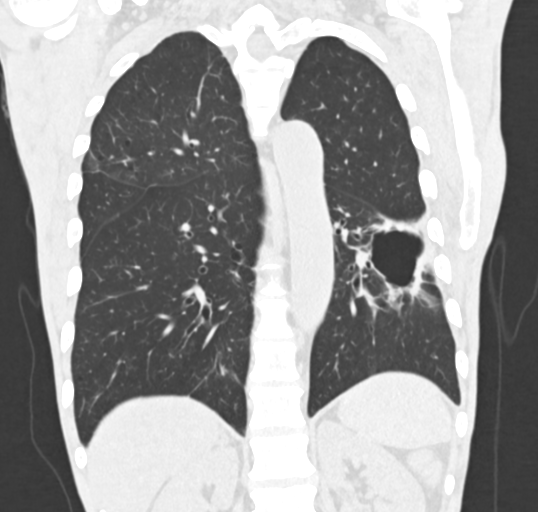

[15 of 36 positions shown; findings below may reference images not displayed]

FINDINGS: Cardiovascular: Upper normal ascending aortic size 3.8 cm. Normal
heart size, without pericardial effusion.

Mediastinum/Nodes: No mediastinal or definite hilar adenopathy,
given limitations of unenhanced CT. Subtle fluid level in the
esophagus on 77/2.

Lungs/Pleura: Near complete resolution of left-sided pleural
effusion with minimal/trace loculated fluid or thickening remaining.

Mild centrilobular emphysema.

Right middle lobe subpleural 3 mm nodule on 84/3 and right lower
lobe pleural-based 4 mm nodule on 111/3 are not significantly
changed.

A 4 mm lingular nodule on 84/3 is unchanged.

Significant improvement in the superior segment left lower lobe
cavitary process, favoring cavitary pneumonia. Measures on the order
of 4.8 x 4.1 cm on 82/3 today versus 8.9 x 5.3 cm on the prior exam.
Significantly decrease in wall thickness. Near complete resolution
of surrounding airspace and ground-glass opacity.

Upper Abdomen: Hepatic steatosis. Moderate cirrhosis. Chronic
calcific pancreatitis. Normal imaged portions of the spleen, adrenal
glands, kidneys.

Musculoskeletal: Mild right-sided gynecomastia. Bilateral nonacute
rib fractures.
IMPRESSION: 1. Significantly improved superior segment left lower lobe cavitary
process, most consistent with resolving cavitary pneumonia.
2.  No acute process in the chest.
3. Tiny pulmonary nodules. Given risk factors for bronchogenic
carcinoma, follow-up CT at twelve months should be considered. This
recommendation follows the consensus statement: Guidelines for
Management of Incidental Pulmonary Nodules Detected on CT
Images:From the [HOSPITAL] 2260; published online before
print (10.1148/[id number]).
4.  Emphysema ([id number]).
5. Hepatic steatosis, cirrhosis, and chronic calcific pancreatitis.
6. Esophageal air fluid level suggests dysmotility or
gastroesophageal reflux.
